# Patient Record
Sex: Female | Born: 1975 | Race: White | Hispanic: No | Marital: Married | Smoking: Never smoker
Health system: Southern US, Community
[De-identification: ages and names within clinical notes are randomized; demographics above are authoritative.]

## PROBLEM LIST (undated history)

## (undated) DIAGNOSIS — R519 Headache, unspecified: Secondary | ICD-10-CM

## (undated) DIAGNOSIS — J45909 Unspecified asthma, uncomplicated: Secondary | ICD-10-CM

## (undated) DIAGNOSIS — B019 Varicella without complication: Secondary | ICD-10-CM

## (undated) DIAGNOSIS — M1990 Unspecified osteoarthritis, unspecified site: Secondary | ICD-10-CM

## (undated) DIAGNOSIS — I4891 Unspecified atrial fibrillation: Secondary | ICD-10-CM

## (undated) DIAGNOSIS — G43909 Migraine, unspecified, not intractable, without status migrainosus: Secondary | ICD-10-CM

## (undated) DIAGNOSIS — I471 Supraventricular tachycardia, unspecified: Secondary | ICD-10-CM

## (undated) DIAGNOSIS — F32A Depression, unspecified: Secondary | ICD-10-CM

## (undated) HISTORY — DX: Depression, unspecified: F32.A

## (undated) HISTORY — DX: Supraventricular tachycardia, unspecified: I47.10

## (undated) HISTORY — PX: KNEE SURGERY: SHX244

## (undated) HISTORY — PX: GALLBLADDER SURGERY: SHX652

## (undated) HISTORY — DX: Headache, unspecified: R51.9

## (undated) HISTORY — PX: BREAST BIOPSY: SHX20

## (undated) HISTORY — DX: Unspecified osteoarthritis, unspecified site: M19.90

## (undated) HISTORY — PX: SHOULDER ARTHROSCOPY: SHX128

## (undated) HISTORY — DX: Unspecified asthma, uncomplicated: J45.909

## (undated) HISTORY — DX: Varicella without complication: B01.9

## (undated) HISTORY — DX: Migraine, unspecified, not intractable, without status migrainosus: G43.909

## (undated) HISTORY — DX: Unspecified atrial fibrillation: I48.91

## (undated) HISTORY — DX: Supraventricular tachycardia: I47.1

---
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
## 2010-01-15 ENCOUNTER — Encounter: Admission: RE | Admit: 2010-01-15 | Discharge: 2010-01-15 | Payer: Self-pay | Admitting: Family Medicine

## 2010-01-16 ENCOUNTER — Encounter: Payer: Self-pay | Admitting: Unknown Physician Specialty

## 2011-06-16 HISTORY — PX: CHOLECYSTECTOMY: SHX55

## 2011-06-16 HISTORY — PX: ENDOMETRIAL ABLATION: SHX621

## 2013-03-03 DIAGNOSIS — Z832 Family history of diseases of the blood and blood-forming organs and certain disorders involving the immune mechanism: Secondary | ICD-10-CM | POA: Insufficient documentation

## 2015-11-28 ENCOUNTER — Ambulatory Visit (INDEPENDENT_AMBULATORY_CARE_PROVIDER_SITE_OTHER): Payer: 59

## 2015-11-28 ENCOUNTER — Other Ambulatory Visit: Payer: Self-pay | Admitting: Unknown Physician Specialty

## 2015-11-28 DIAGNOSIS — R1031 Right lower quadrant pain: Secondary | ICD-10-CM

## 2015-11-28 DIAGNOSIS — R1032 Left lower quadrant pain: Secondary | ICD-10-CM | POA: Diagnosis not present

## 2016-06-12 ENCOUNTER — Ambulatory Visit (INDEPENDENT_AMBULATORY_CARE_PROVIDER_SITE_OTHER): Payer: 59

## 2016-06-12 ENCOUNTER — Other Ambulatory Visit: Payer: Self-pay | Admitting: Unknown Physician Specialty

## 2016-06-12 DIAGNOSIS — R0602 Shortness of breath: Secondary | ICD-10-CM

## 2016-06-12 DIAGNOSIS — R079 Chest pain, unspecified: Secondary | ICD-10-CM

## 2016-06-15 HISTORY — PX: SHOULDER ARTHROSCOPY: SHX128

## 2016-06-15 HISTORY — PX: KNEE ARTHROSCOPY: SUR90

## 2018-04-14 IMAGING — DX DG CHEST 2V
2 series · 2 of 2 positions shown · non-contrast
Comparison: None.

CLINICAL DATA: Central chest pain, shortness of Breath

EXAM:
CHEST  2 VIEW

[chest pa]
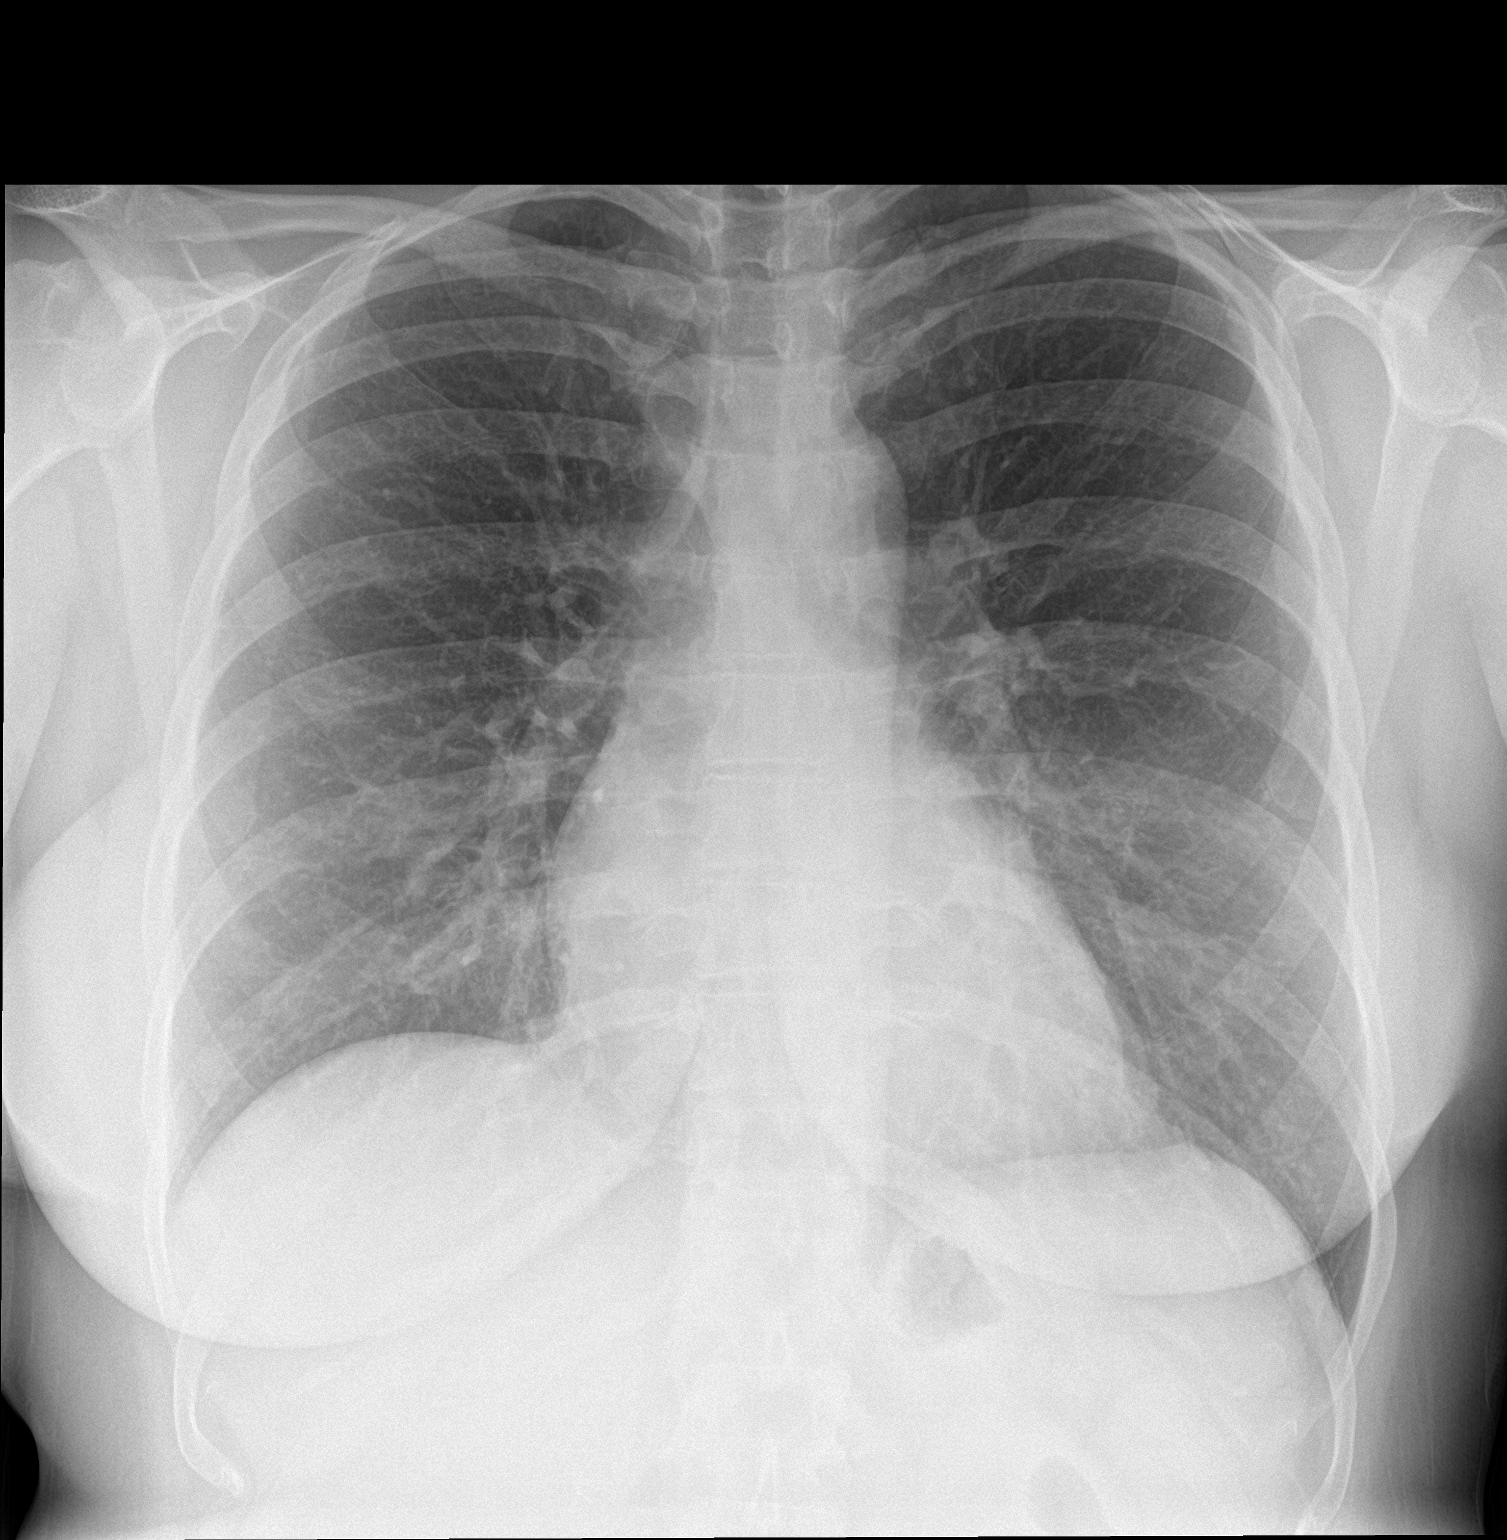

[chest lat]
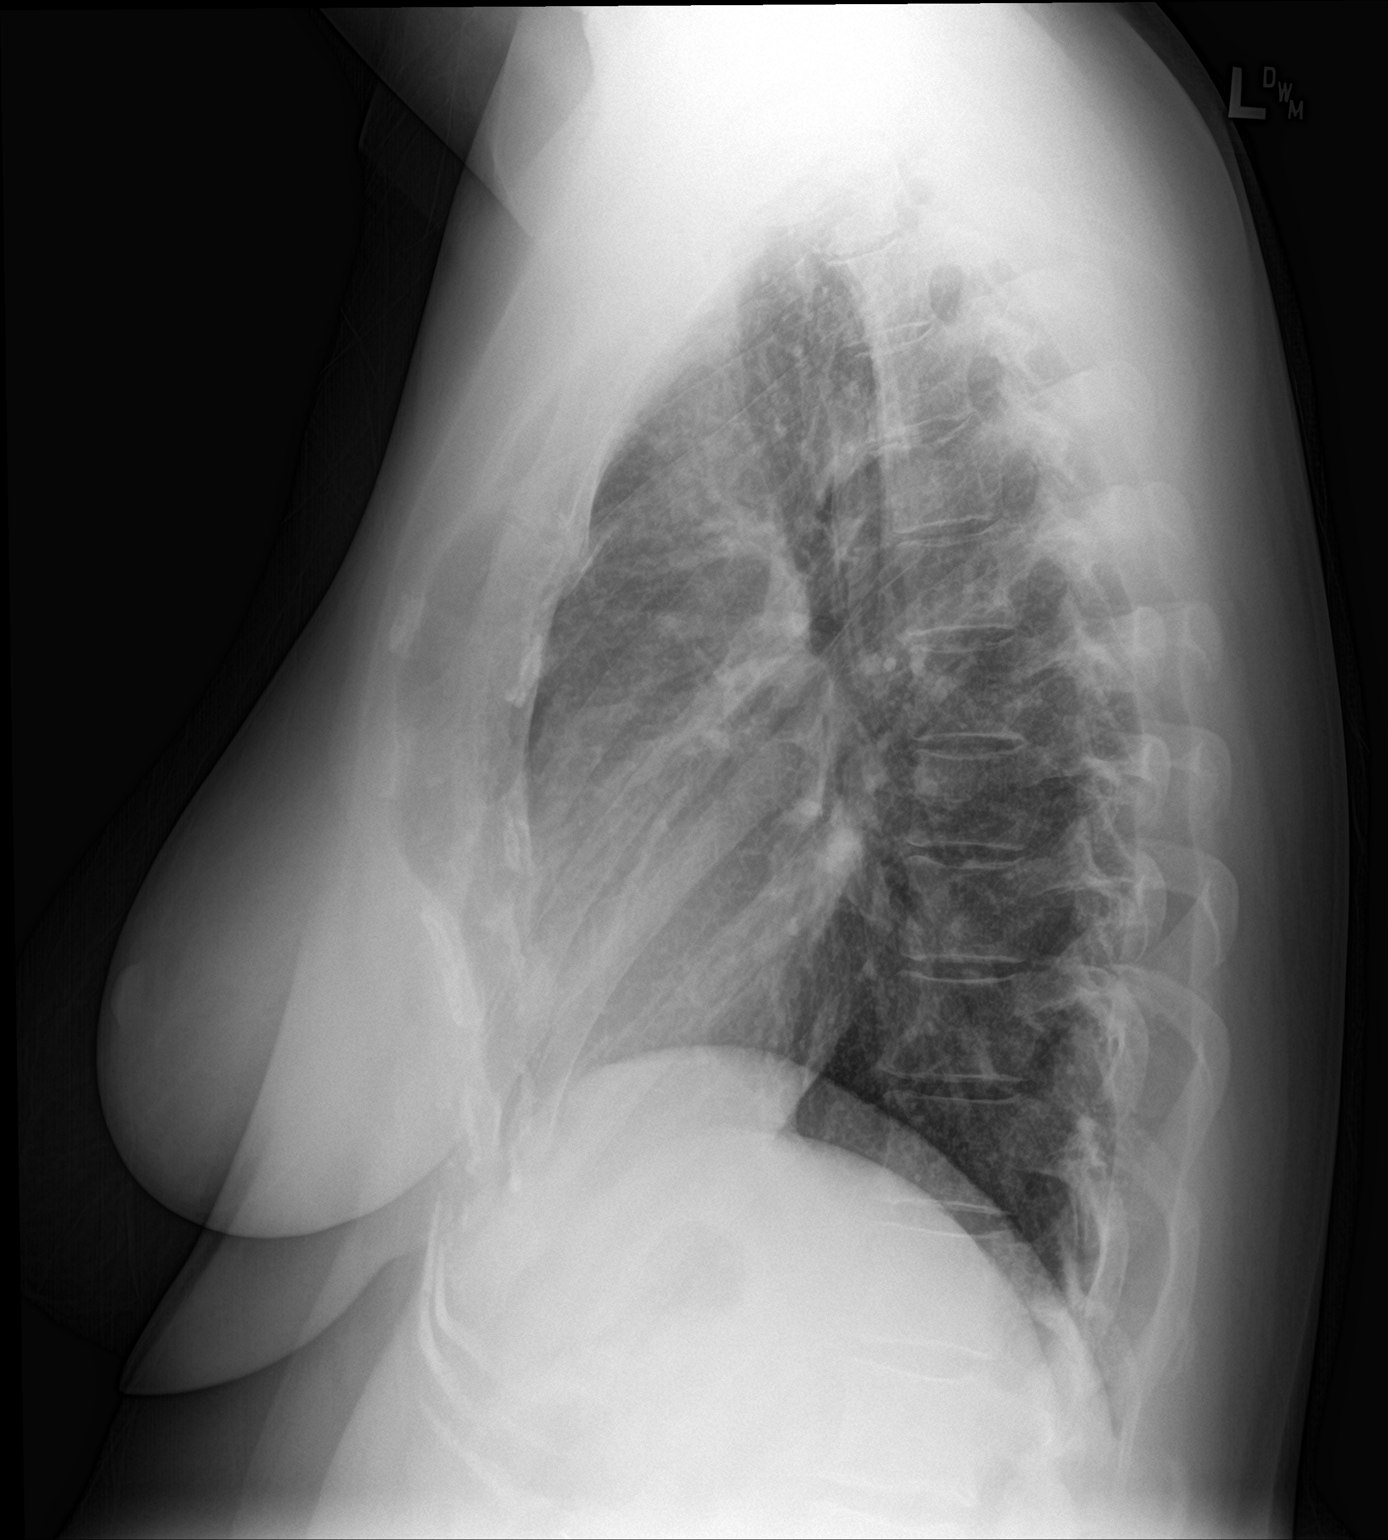

[2 of 2 positions shown; findings below may reference images not displayed]

FINDINGS: The heart size and mediastinal contours are within normal limits.
Both lungs are clear. The visualized skeletal structures are
unremarkable.
IMPRESSION: No active cardiopulmonary disease.

## 2018-06-15 HISTORY — PX: BREAST BIOPSY: SHX20

## 2018-12-28 ENCOUNTER — Other Ambulatory Visit: Payer: Self-pay

## 2018-12-28 ENCOUNTER — Telehealth: Payer: Self-pay | Admitting: Allergy & Immunology

## 2018-12-28 DIAGNOSIS — Z20828 Contact with and (suspected) exposure to other viral communicable diseases: Secondary | ICD-10-CM

## 2018-12-28 DIAGNOSIS — Z20822 Contact with and (suspected) exposure to covid-19: Secondary | ICD-10-CM

## 2018-12-28 NOTE — Telephone Encounter (Signed)
Patient requested COVID19 testing as a requirement prior to a job. Order placed. Patient was instructed to go to the testing site for collection.  Mi Balla, MD [facility] 

## 2019-01-01 LAB — NOVEL CORONAVIRUS, NAA: SARS-CoV-2, NAA: NOT DETECTED

## 2019-10-02 ENCOUNTER — Ambulatory Visit (INDEPENDENT_AMBULATORY_CARE_PROVIDER_SITE_OTHER): Payer: 59

## 2019-10-02 ENCOUNTER — Other Ambulatory Visit: Payer: Self-pay

## 2019-10-02 ENCOUNTER — Other Ambulatory Visit: Payer: Self-pay | Admitting: Unknown Physician Specialty

## 2019-10-02 DIAGNOSIS — R0602 Shortness of breath: Secondary | ICD-10-CM | POA: Diagnosis not present

## 2019-11-25 ENCOUNTER — Ambulatory Visit: Payer: Self-pay

## 2019-11-25 ENCOUNTER — Emergency Department
Admission: EM | Admit: 2019-11-25 | Discharge: 2019-11-25 | Disposition: A | Discharge status: Home / Self Care | Payer: 59 | Source: Home / Self Care

## 2019-11-25 ENCOUNTER — Encounter: Payer: Self-pay | Admitting: Emergency Medicine

## 2019-11-25 ENCOUNTER — Other Ambulatory Visit: Payer: Self-pay

## 2019-11-25 DIAGNOSIS — J020 Streptococcal pharyngitis: Secondary | ICD-10-CM | POA: Diagnosis not present

## 2019-11-25 DIAGNOSIS — L550 Sunburn of first degree: Secondary | ICD-10-CM

## 2019-11-25 DIAGNOSIS — M7989 Other specified soft tissue disorders: Secondary | ICD-10-CM

## 2019-11-25 DIAGNOSIS — J029 Acute pharyngitis, unspecified: Secondary | ICD-10-CM | POA: Diagnosis not present

## 2019-11-25 LAB — POCT RAPID STREP A (OFFICE): Rapid Strep A Screen: NEGATIVE

## 2019-11-25 NOTE — ED Triage Notes (Signed)
Patient awoke this am to her right lower leg/foot swelling, no injury, tight feeling, some sun burn.

## 2019-11-25 NOTE — ED Provider Notes (Signed)
[facility]    CSN: [id number] Arrival date & time: 11/25/19  0849      History   Chief Complaint Chief Complaint  Patient presents with  . leg swelling    HPI Caroline Gill is a 44 y.o. female.   HPI Caroline Gill is a 44 y.o. female presenting to UC with c/o bilateral leg swelling and mild pain, worse in the Right leg compared to the Left. Pt states the swelling has improved but is still present.  She feels a tightness in her feet. She also reports mild pain where she got a sunburn on both her legs and feet this past week while she was at [employer]. She road back in a car, a 3-4 hour car ride, the other day.  She has applied body lotion to the sunburn without relief. No other treatments tried. Denies calf pain. No hx of blood clots. No recent surgery. No chest pain or SOB.  Pt also c/o a mild sore throat that started 2 days ago. Denies cough, congestion, fever, chills, n/v/d. No sick contacts.     History reviewed. No pertinent past medical history.  There are no problems to display for this patient.   History reviewed. No pertinent surgical history.  OB History   No obstetric history on file.      Home Medications    Prior to Admission medications   Medication Sig Start Date End Date Taking? Authorizing Provider  Levocetirizine Dihydrochloride (XYZAL PO) Take by mouth.   Yes [provider]  Montelukast Sodium (SINGULAIR PO) Take by mouth.   Yes [provider]    Family History No family history on file.  Social History Social History   Tobacco Use  . Smoking status: Never Smoker  . Smokeless tobacco: Never Used  Substance Use Topics  . Alcohol use: Not on file  . Drug use: Not on file     Allergies   Patient has no known allergies.   Review of Systems Review of Systems  Constitutional: Negative for chills and fever.  HENT: Positive for congestion (minimal), postnasal drip and sore throat. Negative for ear  pain and trouble swallowing.   Respiratory: Negative for cough.   Musculoskeletal: Positive for arthralgias, joint swelling and myalgias. Negative for gait problem.  Skin: Positive for color change. Negative for wound.  Neurological: Negative for dizziness and headaches.     Physical Exam Triage Vital Signs ED Triage Vitals [11/25/19 0917]  Enc Vitals Group     BP 120/80     Pulse Rate (!) 101     Resp      Temp      Temp src      SpO2 97 %     Weight      Height      Head Circumference      Peak Flow      Pain Score 0     Pain Loc      Pain Edu?      Excl. in [place]?    No data found.  Updated Vital Signs BP 120/80 (BP Location: Right Arm)   Pulse (!) 101   Temp 99.3 F (37.4 C) (Oral)   SpO2 97%   Visual Acuity Right Eye Distance:   Left Eye Distance:   Bilateral Distance:    Right Eye Near:   Left Eye Near:    Bilateral Near:     Physical Exam Vitals and nursing note reviewed.  Constitutional:  General: She is not in acute distress.    Appearance: Normal appearance. She is well-developed. She is not ill-appearing, toxic-appearing or diaphoretic.  HENT:     Head: Normocephalic and atraumatic.     Right Ear: Tympanic membrane and ear canal normal.     Left Ear: Tympanic membrane and ear canal normal.     Nose: Nose normal.     Right Sinus: No maxillary sinus tenderness or frontal sinus tenderness.     Left Sinus: No maxillary sinus tenderness or frontal sinus tenderness.     Mouth/Throat:     Lips: Pink.     Mouth: Mucous membranes are moist.     Pharynx: Oropharynx is clear. Uvula midline. Posterior oropharyngeal erythema (mild) present. No pharyngeal swelling, oropharyngeal exudate or uvula swelling.  Cardiovascular:     Rate and Rhythm: Normal rate and regular rhythm.     Pulses:          Dorsalis pedis pulses are 2+ on the right side and 2+ on the left side.       Posterior tibial pulses are 2+ on the right side and 2+ on the left side.    Pulmonary:     Effort: Pulmonary effort is normal. No respiratory distress.     Breath sounds: Normal breath sounds. No stridor. No wheezing, rhonchi or rales.  Musculoskeletal:        General: Swelling and tenderness present. Normal range of motion.     Cervical back: Normal range of motion.     Comments: Bilateral lower legs: mild edema bilaterally into feet. Calves are soft, non-tender. Muscle compartments are soft. No masses palpated.  Mild tenderness to anterior aspects of lower legs (superficial sunburn also noted on anterior aspect of lower legs and feet)  Full ROM knees, ankles and toes.   Skin:    General: Skin is warm and dry.     Capillary Refill: Capillary refill takes less than 2 seconds.  Neurological:     Mental Status: She is alert and oriented to person, place, and time.  Psychiatric:        Behavior: Behavior normal.      UC Treatments / Results  Labs (all labs ordered are listed, but only abnormal results are displayed) Labs Reviewed  STREP A DNA PROBE  POCT RAPID STREP A (OFFICE)    EKG   Radiology No results found.  Procedures Procedures (including critical care time)  Medications Ordered in UC Medications - No data to display  Initial Impression / Assessment and Plan / UC Course  I have reviewed the triage vital signs and the nursing notes.  Pertinent labs & imaging results that were available during my care of the patient were reviewed by me and considered in my medical decision making (see chart for details).     Rapid strep: NEGATIVE Bilateral leg swelling likely reaction to superficial sunburns and mild dehydration Low concern for DVT, however, due to reports of Right leg being more swollen than the Left this morning as well as recent 3-4 hour car ride, venous ultrasound ordered for bilateral lower extremities to r/o DVT. [place] not available at [facility] today, order placed for [facility]. AVS provided  Final Clinical Impressions(s) / UC Diagnoses    Final diagnoses:  Sore throat  Leg swelling  Sunburn of first degree     Discharge Instructions      You may take 500mg  acetaminophen every 4-6 hours or in combination with ibuprofen 400-600mg  every 6-8 hours as  needed for pain, inflammation, and fever.  Be sure to well hydrated with clear liquids and get at least 8 hours of sleep at night, preferably more while sick.   Please follow up with family medicine in 1 week if needed.  You have an ultrasound order for you at [facility].  This would be done on your legs to rule out a blood clot. If you decide not to go now, it is recommended you go if symptoms worsen- worsening swelling, pain, especially if one side is worse. A blood clot can be very dangerous as there is risk of it breaking off and going to the heart, lungs or brain- which can lead to heart attack, stroke, and even death.  Our office is open unitil 4PM this evening and 8a-4p tomorrow if you had additional questions or concerns.     ED Prescriptions    None     PDMP not reviewed this encounter.   Lurene Shadow, [place] 11/26/19 [phone]

## 2019-11-25 NOTE — Discharge Instructions (Signed)
  You may take 500mg  acetaminophen every 4-6 hours or in combination with ibuprofen 400-600mg  every 6-8 hours as needed for pain, inflammation, and fever.  Be sure to well hydrated with clear liquids and get at least 8 hours of sleep at night, preferably more while sick.   Please follow up with family medicine in 1 week if needed.  You have an ultrasound order for you at [facility].  This would be done on your legs to rule out a blood clot. If you decide not to go now, it is recommended you go if symptoms worsen- worsening swelling, pain, especially if one side is worse. A blood clot can be very dangerous as there is risk of it breaking off and going to the heart, lungs or brain- which can lead to heart attack, stroke, and even death.  Our office is open unitil 4PM this evening and 8a-4p tomorrow if you had additional questions or concerns.

## 2019-11-25 NOTE — ED Triage Notes (Signed)
Patient also c/o throat pain/swelling since Thursday.

## 2019-11-26 LAB — STREP A DNA PROBE: Group A Strep Probe: NOT DETECTED

## 2019-11-27 ENCOUNTER — Telehealth: Payer: Self-pay | Admitting: Emergency Medicine

## 2019-11-27 NOTE — Telephone Encounter (Signed)
Pt states leg pain and swelling has improved. She does not plan to get the venous ultrasound at this time.

## 2020-02-27 ENCOUNTER — Telehealth: Payer: Self-pay | Admitting: Allergy & Immunology

## 2020-02-27 MED ORDER — PREDNISONE 10 MG PO TABS: ORAL_TABLET | ORAL | 0 refills | Status: DC

## 2020-02-27 NOTE — Telephone Encounter (Signed)
Since him prednisone to help her get off her antihistamines in anticipation for the visit next week.   Malachi Bonds, MD [facility]

## 2020-03-04 ENCOUNTER — Other Ambulatory Visit: Payer: Self-pay

## 2020-03-04 ENCOUNTER — Encounter: Payer: Self-pay | Admitting: Allergy & Immunology

## 2020-03-04 ENCOUNTER — Ambulatory Visit: Payer: 59 | Admitting: Allergy & Immunology

## 2020-03-04 VITALS — BP 110/70 | HR 85 | Temp 97.7°F | Resp 18 | Ht 70.0 in | Wt 256.0 lb

## 2020-03-04 DIAGNOSIS — L239 Allergic contact dermatitis, unspecified cause: Secondary | ICD-10-CM | POA: Diagnosis not present

## 2020-03-04 DIAGNOSIS — J302 Other seasonal allergic rhinitis: Secondary | ICD-10-CM

## 2020-03-04 DIAGNOSIS — J3089 Other allergic rhinitis: Secondary | ICD-10-CM | POA: Diagnosis not present

## 2020-03-04 DIAGNOSIS — T7840XA Allergy, unspecified, initial encounter: Secondary | ICD-10-CM | POA: Insufficient documentation

## 2020-03-04 DIAGNOSIS — J4520 Mild intermittent asthma, uncomplicated: Secondary | ICD-10-CM | POA: Diagnosis not present

## 2020-03-04 DIAGNOSIS — T7840XD Allergy, unspecified, subsequent encounter: Secondary | ICD-10-CM | POA: Diagnosis not present

## 2020-03-04 DIAGNOSIS — J45909 Unspecified asthma, uncomplicated: Secondary | ICD-10-CM | POA: Insufficient documentation

## 2020-03-04 MED ORDER — MONTELUKAST SODIUM 10 MG PO TABS: 10.0000 mg | ORAL_TABLET | Freq: Every day | ORAL | 1 refills | Status: DC

## 2020-03-04 MED ORDER — LEVOCETIRIZINE DIHYDROCHLORIDE 5 MG PO TABS: 5.0000 mg | ORAL_TABLET | Freq: Every day | ORAL | 1 refills | Status: DC

## 2020-03-04 MED ORDER — EPINEPHRINE 0.3 MG/0.3ML IJ SOAJ
0.3000 mg | INTRAMUSCULAR | 1 refills | Status: DC | PRN
Start: 2020-03-04 — End: 2021-07-29

## 2020-03-04 NOTE — Patient Instructions (Addendum)
1. Allergic reaction - unknown trigger - All of the foods tested were negative today. - Copy of test results provided. - We are going to get some labs to rule out weird causes of allergic reactions, including a Brussels sprouts IgE - We will call you in 1-2 weeks with the results of the testing.  - Auvi[employer] refilled today. - They should call you to confirm your shipping address.   2. Seasonal and perennial allergic rhinitis - Testing today showed: grasses, ragweed, weeds, trees and dog - Copy of test results provided.  - Avoidance measures provided. - Continue with: Xyzal (levocetirizine) 5mg  tablet 1-2 times daily and Singulair (montelukast) 10mg  daily - You can use an extra dose of the antihistamine, if needed, for breakthrough symptoms.  - Consider nasal saline rinses 1-2 times daily to remove allergens from the nasal cavities as well as help with mucous clearance (this is especially helpful to do before the nasal sprays are given) - We could consider allergy shots in the future, although with your history of large local reactions, we would have to go slow and might combine it with epinephrine rinses to try to manage these.   3. Possible contact dermatitis - Consider patch testing in the future. - You can even bring in your own cosmetics to have these tested. - These are placed on a Monday and then read on a Wednesday and Friday.   4. Return in about 3 weeks (around 03/25/2020) for [facility] TESTING (can be placed here in [facility] on Monday October 11th).    Please inform Saturday of any Emergency Department visits, hospitalizations, or changes in symptoms. Call October 13 before going to the ED for breathing or allergy symptoms since we might be able to fit you in for a sick visit. Feel free to contact us anytime with any questions, problems, or concerns.  It was a pleasure to meet you today!  Websites that have reliable patient information: 1. American Academy of Asthma, Allergy, and Immunology:  www.aaaai.org 2. Food Allergy Research and Education (FARE): foodallergy.org 3. Mothers of Asthmatics: http://www.asthmacommunitynetwork.org 4. American College of Allergy, Asthma, and Immunology: www.acaai.org   COVID-19 Vaccine Information can be found at: [place] For questions related to vaccine distribution or appointments, please email [email or web address] or call [phone].     "Like" [email or web address] on Facebook and Instagram for our latest updates!        Make sure you are registered to vote! If you have moved or changed any of your contact information, you will need to get this updated before voting!  In some cases, you MAY be able to register to vote online: [phone]     True Test looks for the following sensitivities:      Reducing Pollen Exposure  The American Academy of Allergy, Asthma and Immunology suggests the following steps to reduce your exposure to pollen during allergy seasons.    1. Do not hang sheets or clothing out to dry; pollen may collect on these items. 2. Do not mow lawns or spend time around freshly cut grass; mowing stirs up pollen. 3. Keep windows closed at night.  Keep car windows closed while driving. 4. Minimize morning activities outdoors, a time when pollen counts are usually at their highest. 5. Stay indoors as much as possible when pollen counts or humidity is high and on windy days when pollen tends to remain in the air longer. 6. Use air conditioning when possible.  Many air conditioners have filters that trap the pollen spores.  7. Use a HEPA room air filter to remove pollen form the indoor air you breathe.  Control of Dog or Cat Allergen  Avoidance is the best way to manage a dog or cat allergy. If you have a dog or cat and are allergic to dog or cats, consider removing the dog or cat from the home. If you have a dog or cat but don't  want to find it a new home, or if your family wants a pet even though someone in the household is allergic, here are some strategies that may help keep symptoms at bay:  1. Keep the pet out of your bedroom and restrict it to only a few rooms. Be advised that keeping the dog or cat in only one room will not limit the allergens to that room. 2. Don't pet, hug or kiss the dog or cat; if you do, wash your hands with soap and water. 3. High-efficiency particulate air (HEPA) cleaners run continuously in a bedroom or living room can reduce allergen levels over time. 4. Regular use of a high-efficiency vacuum cleaner or a central vacuum can reduce allergen levels. 5. Giving your dog or cat a bath at least once a week can reduce airborne allergen.

## 2020-03-04 NOTE — Progress Notes (Signed)
NEW PATIENT  Date of Service/Encounter:  03/04/20  Referring provider: Lasandra Beech, MD   Assessment:   Allergic reaction - unclear trigger, but testing unrevealing with regards to food allergies  Seasonal and perennial allergic rhinitis (grasses, weeds, ragweed, trees, dog)  Possible contact dermatitis - getting True Test patching done in three weeks   It is really unclear what is causing Caroline Gill's reactions, but we are going to do an extensive workup to rule out serious causes of swelling and hives. We have a few things going on with her, it seems, including contact dermatitis. We are going to do patch testing to clarify any any allergic contact dermatitis triggers. We are also going to rule out causes of swelling.hives to see if this is contributing to her symptoms, including a tryptase to rule out mast cell disease. We are refilling her epinephrine on the off chance that she she needs it. Hopefully this lab work will point towards an etiology of her symptoms. We could also consider the addition of Xolair as an immunomodulatory agent to prevent these episodes, but we have some more steps to do before we get to that point.  Plan/Recommendations:   1. Allergic reaction - unknown trigger - All of the foods tested were negative today. - Copy of test results provided. - We are going to get some labs to rule out weird causes of allergic reactions, including a Brussels sprouts IgE - We will call you in 1-2 weeks with the results of the testing.  - AuviQ refilled today. - They should call you to confirm your shipping address.   2. Seasonal and perennial allergic rhinitis - Testing today showed: grasses, ragweed, weeds, trees and dog - Copy of test results provided.  - Avoidance measures provided. - Continue with: Xyzal (levocetirizine)  tablet 1-2 times daily and Singulair (montelukast)  daily - You can use an extra dose of the antihistamine, if needed, for breakthrough  symptoms.  - Consider nasal saline rinses 1-2 times daily to remove allergens from the nasal cavities as well as help with mucous clearance (this is especially helpful to do before the nasal sprays are given) - We could consider allergy shots in the future, although with your history of large local reactions, we would have to go slow and might combine it with epinephrine rinses to try to manage these.   3. Possible contact dermatitis - Consider patch testing in the future. - You can even bring in your own cosmetics to have these tested. - These are placed on a Monday and then read on a Wednesday and Friday.   4. Return in about 3 weeks (around 03/25/2020) for [facility] TESTING (can be placed here in [facility] on Monday October 11th).    Subjective:   Caroline Gill is a 44 y.o. female presenting today for evaluation of  Chief Complaint  Patient presents with  . Allergic Reaction    Caroline Gill has a history of the following: Patient Active Problem List   Diagnosis Date Noted  . Allergic reaction 03/04/2020  . Extrinsic asthma 03/04/2020  . Seasonal and perennial allergic rhinitis 03/04/2020  . Allergic contact dermatitis 03/04/2020    History obtained from: chart review and patient.  Caroline Gill was referred by Caroline Beech, MD.     Caroline Gill is a 44 y.o. female presenting for an evaluation of allergic reactions. Last Xyzal was Thursday night.   She has a history of reactions for years. She has had allergies for  15-20 years. It really got bad and she did shots for one year. She had allergic reactions to the shots themselves. She continued to have large local reactions that involved her entire upper arm. This was in 2019 or possibly before that.  She had a reaction to a charcoal mask with hives and itching over her face. This took several days to clear up and it finally cleared up with three Benadryl.   Then one week ago Friday, she was eating at a restaurant for lunch  (September 10th) and it had Brussels sprouts. She reports a history of numbness in the whole inside of her mouth. This has happened twice with the same result, so she did not go ahead with it. She had a meal that had Brussels sprouts in the mixed vegetable part. She started having itching of her mouth and then she was in the bathroom. She had diarrhea afterwards and went home. She slept on the cough and then it started going to the side of her face and cheeks. She took two Xyzal that evening. Her chest tightness worsened. She did not want to take Benadryl because it knocks her out completely. Her husband came home at 11pm and she took Benadryl. The hives were better by the next morning and she felt sick.   Then on Tuesday September 14th, she put on some lotion from [place] and [employer]. She started feeling phlegmy and it cleared up with Benadryl.   She has not noticed any other foods that bother her. She does not eat cabbage. She seems to be fine with most of the major food allergens. She does have some nausea with eggs. Milk does not seem to bother her. She eats macadamia milk. She does have an AuviQ that expired, otherwise she does not need it.    Asthma/Respiratory Symptom History: She has a history of wheezing with albuterol. She typically never refills her albuterol.   Allergic Rhinitis Symptom History: She is currently on Xyzal and Singulair. She was on a nose spray at one point but she developed nosebleeds. She has a history of chronic postnasal drip and congestion. She has a history of environmental allergies to dogs.   Eczema Symptom History: She has a history of dry skin and uses Cerve for this. She has had Eucrisa  prescribed for her lesions on her legs.   Otherwise, there is no history of other atopic diseases, including drug allergies, stinging insect allergies, eczema, urticaria or contact dermatitis. There is no significant infectious history. Vaccinations are up to date.    Past Medical  History: Patient Active Problem List   Diagnosis Date Noted  . Allergic reaction 03/04/2020  . Extrinsic asthma 03/04/2020  . Seasonal and perennial allergic rhinitis 03/04/2020  . Allergic contact dermatitis 03/04/2020    Medication List:  Allergies as of 03/04/2020      Reactions   Oxycodone Other (See Comments)   Severe headache      Medication List       Accurate as of March 04, 2020  1:11 PM. If you have any questions, ask your nurse or doctor.        STOP taking these medications   predniSONE 10 MG tablet Commonly known as: DELTASONE Stopped by: Alfonse Spruce, MD     TAKE these medications   albuterol 108 (90 Base) MCG/ACT inhaler Commonly known as: VENTOLIN HFA Inhale 2 puffs into the lungs every 6 (six) hours as needed for wheezing or shortness of breath.  EPINEPHrine 0.3 mg/0.3 mL Soaj injection Commonly known as: Auvi-Q Inject 0.3 mg into the muscle as needed for anaphylaxis. Started by: Alfonse Spruce, MD   levocetirizine 5 MG tablet Commonly known as: XYZAL Take 1 tablet (5 mg total) by mouth daily. What changed:   medication strength  how much to take  when to take this Changed by: Alfonse Spruce, MD   montelukast 10 MG tablet Commonly known as: SINGULAIR Take 1 tablet (10 mg total) by mouth at bedtime. What changed:   medication strength  how much to take  when to take this Changed by: Alfonse Spruce, MD       Birth History: non-contributory  Developmental History: non-contributory  Past Surgical History: Past Surgical History:  Procedure Laterality Date  . GALLBLADDER SURGERY    . KNEE SURGERY Bilateral   . SHOULDER ARTHROSCOPY       Family History: Family History  Problem Relation Age of Onset  . Asthma Brother   . Angioedema Neg Hx   . Allergic rhinitis Neg Hx   . Immunodeficiency Neg Hx   . Urticaria Neg Hx   . Eczema Neg Hx      Social History: Hannie lives at home with her  husband and one of her children.  She has 2 children that are out of the house.  They live in a house that is 46 years old.  There is virtually vinyl plank in the main living areas and carpeting in the bedroom.  They have gas heating and central cooling.  There is a dog inside of the home.  There are no dust mite covers on the bedding.  There is no tobacco exposure.  Review of Systems  Constitutional: Negative.  Negative for chills, fever, malaise/fatigue and weight loss.  HENT: Positive for congestion and sinus pain. Negative for ear discharge and ear pain.   Eyes: Negative for pain, discharge and redness.  Respiratory: Negative for cough, sputum production, shortness of breath and wheezing.   Cardiovascular: Negative.  Negative for chest pain and palpitations.  Gastrointestinal: Negative for abdominal pain, constipation, diarrhea, heartburn, nausea and vomiting.  Skin: Positive for itching and rash.  Neurological: Negative for dizziness and headaches.  Endo/Heme/Allergies: Positive for environmental allergies. Does not bruise/bleed easily.       Objective:   Blood pressure 110/70, pulse 85, temperature 97.7 F (36.5 C), temperature source Temporal, resp. rate 18, height 5\' 10"  (1.778 m), weight 256 lb (116.1 kg), SpO2 95 %. Body mass index is 36.73 kg/m.   Physical Exam:   Physical Exam Constitutional:      Appearance: She is well-developed.     Comments: Very pleasant female.  Talkative.  HENT:     Head: Normocephalic and atraumatic.     Right Ear: Tympanic membrane, ear canal and external ear normal. No drainage, swelling or tenderness. Tympanic membrane is not injected, scarred, erythematous, retracted or bulging.     Left Ear: Tympanic membrane, ear canal and external ear normal. No drainage, swelling or tenderness. Tympanic membrane is not injected, scarred, erythematous, retracted or bulging.     Nose: No nasal deformity, septal deviation, mucosal edema or rhinorrhea.      Right Turbinates: Enlarged and swollen.     Left Turbinates: Enlarged and swollen.     Right Sinus: No maxillary sinus tenderness or frontal sinus tenderness.     Left Sinus: No maxillary sinus tenderness or frontal sinus tenderness.     Comments: There is some cobblestoning  in the posterior oropharynx.    Mouth/Throat:     Mouth: Mucous membranes are not pale and not dry.     Pharynx: Uvula midline.  Eyes:     General:        Right eye: No discharge.        Left eye: No discharge.     Conjunctiva/sclera: Conjunctivae normal.     Right eye: Right conjunctiva is not injected. No chemosis.    Left eye: Left conjunctiva is not injected. No chemosis.    Pupils: Pupils are equal, round, and reactive to light.  Cardiovascular:     Rate and Rhythm: Normal rate and regular rhythm.     Heart sounds: Normal heart sounds.  Pulmonary:     Effort: Pulmonary effort is normal. No tachypnea, accessory muscle usage or respiratory distress.     Breath sounds: Normal breath sounds. No wheezing, rhonchi or rales.     Comments: Moving air well in all lung fields.  No increased work of breathing. Chest:     Chest wall: No tenderness.  Abdominal:     Tenderness: There is no abdominal tenderness. There is no guarding or rebound.  Lymphadenopathy:     Head:     Right side of head: No submandibular, tonsillar or occipital adenopathy.     Left side of head: No submandibular, tonsillar or occipital adenopathy.     Cervical: No cervical adenopathy.  Skin:    Coloration: Skin is not pale.     Findings: No abrasion, erythema, petechiae or rash. Rash is not papular, urticarial or vesicular.     Comments: No dermatographia present.  Neurological:     Mental Status: She is alert.      Diagnostic studies:   Allergy Studies:     Airborne Adult Perc - 03/04/20 0900    Time Antigen Placed 4403    Allergen Manufacturer Waynette Buttery    Location Back    Number of Test 59    Panel 1 Select    1. Control-Buffer 50%  Glycerol Negative    2. Control-Histamine 1 mg/ml 2+    3. Albumin saline Negative    4. Bahia 2+    5. [place] 3+    6. Johnson 4+    7. Kentucky Blue 3+    8. Meadow Fescue 3+    9. Perennial Rye 3+    10. Sweet Vernal 3+    11. Timothy 2+    12. Cocklebur Negative    13. Burweed Marshelder Negative    14. Ragweed, short Negative    15. Ragweed, Giant Negative    16. Plantain,  English Negative    17. Lamb's Quarters Negative    18. Sheep Sorrell Negative    19. Rough Pigweed Negative    20. Marsh Elder, Rough Negative    21. Mugwort, Common Negative    22. Ash mix Negative    23. Birch mix Negative    24. Gill American Negative    25. Box, Elder Negative    26. Cedar, red Negative    27. Cottonwood, Guinea-Bissau Negative    28. Elm mix Negative    29. Hickory --   +/-   30. Maple mix Negative    31. Oak, Guinea-Bissau mix Negative    32. Pecan Pollen Negative    33. Pine mix Negative    34. Sycamore Eastern Negative    35. Walnut, Black Pollen Negative    36. Alternaria alternata Negative  37. Cladosporium Herbarum Negative    38. Aspergillus mix Negative    39. Penicillium mix Negative    40. Bipolaris sorokiniana (Helminthosporium) Negative    41. Drechslera spicifera (Curvularia) Negative    42. Mucor plumbeus Negative    43. Fusarium moniliforme Negative    44. Aureobasidium pullulans (pullulara) Negative    45. Rhizopus oryzae Negative    46. Botrytis cinera Negative    47. Epicoccum nigrum Negative    48. Phoma betae Negative    49. Candida Albicans Negative    50. Trichophyton mentagrophytes Negative    51. Mite, D Farinae  5,000 AU/ml Negative    52. Mite, D Pteronyssinus  5,000 AU/ml Negative    53. Cat Hair 10,000 BAU/ml Negative    54.  Dog Epithelia Negative    55. Mixed Feathers Negative    56. Horse Epithelia Negative    57. Cockroach, German Negative    58. Mouse Negative    59. Tobacco Leaf Negative          Intradermal - 03/04/20 1050    Time  Antigen Placed 1030    Allergen Manufacturer Greer    Location Arm    Number of Test 12    Intradermal Select    Control Negative    Ragweed mix 2+    Weed mix 1+    Tree mix 2+    Mold 1 Negative    Mold 2 Negative    Mold 3 Negative    Mold 4 Negative    Cat Negative    Dog 2+    Cockroach Negative    Mite mix Negative          Food Adult Perc - 03/04/20 0900    Time Antigen Placed [id number]    Allergen Manufacturer Waynette ButteryGreer    Location Back    Number of allergen test 72    Panel 2 Select    Control-Histamine 1 mg/ml 2+    1. Peanut Negative    2. Soybean Negative    3. Wheat Negative    4. Sesame Negative    5. Milk, cow Negative    6. Egg White, Chicken Negative    7. Casein Negative    8. Shellfish Mix Negative    9. Fish Mix Negative    10. Cashew Negative    11. Pecan Food Negative    12. Walnut Food Negative    13. Almond Negative    14. Hazelnut Negative    15. EstoniaBrazil nut Negative    16. Coconut Negative    17. Pistachio Negative    18. Catfish Negative    19. Bass Negative    20. Trout Negative    21. Tuna Negative    22. Salmon Negative    23. Flounder Negative    24. Codfish Negative    25. Shrimp Negative    26. Crab Negative    27. Lobster Negative    28. Oyster Negative    29. Scallops Negative    30. Barley Negative    31. Oat  Negative    32. Rye  Negative    33. Hops Negative    34. Rice Negative    35. Cottonseed Negative    36. Saccharomyces Cerevisiae  Negative    37. Pork Negative    38. Malawiurkey Meat Negative    39. Chicken Meat Negative    40. Beef Negative    41. Lamb Negative  42. Tomato Negative    43. White Potato Negative    44. Sweet Potato Negative    45. Pea, Green/English Negative    46. Navy Bean Negative    47. Mushrooms Negative    48. Avocado Negative    49. Onion Negative    50. Cabbage Negative    51. Carrots Negative    52. Celery Negative    53. Corn Negative    54. Cucumber Negative    55. Grape (White  seedless) Negative    56. Orange  Negative    57. Banana Negative    58. Apple Negative    59. Peach Negative    60. Strawberry Negative    61. Cantaloupe Negative    62. Watermelon Negative    63. Pineapple Negative    64. Chocolate/Cacao bean Negative    65. Karaya Gum Negative    66. Acacia (Arabic Gum) Negative    67. Cinnamon Negative    68. Nutmeg Negative    69. Ginger Negative    70. Garlic Negative    71. Pepper, black Negative    72. Mustard Negative           Allergy testing results were read and interpreted by myself, documented by clinical staff.         Malachi Bonds, MD [facility]

## 2020-03-08 NOTE — Progress Notes (Signed)
Her last testing was done in February 2016.  It was positive to trees, grasses, weeds, and dog.  We received a lot of other notes from her previous allergist, but it was all fairly extraneous and I decided not to scan this into the system.  Malachi Bonds, MD [facility]

## 2020-03-17 LAB — ALLERGEN,BRUSSEL SPRTS,RF217: Allergen Brussel Sprouts IgE: <0.1 kU/L

## 2020-03-19 LAB — CBC WITH DIFFERENTIAL/PLATELET
Basophils Absolute: 0.1 10*3/uL (ref 0.0–0.2)
Basos: 1 %
EOS (ABSOLUTE): 0.3 10*3/uL (ref 0.0–0.4)
Eos: 3 %
Hematocrit: 35.4 % (ref 34.0–46.6)
Hemoglobin: 12.3 g/dL (ref 11.1–15.9)
Immature Grans (Abs): 0 10*3/uL (ref 0.0–0.1)
Immature Granulocytes: 0 %
Lymphocytes Absolute: 3 10*3/uL (ref 0.7–3.1)
Lymphs: 30 %
MCH: 29.1 pg (ref 26.6–33.0)
MCHC: 34.7 g/dL (ref 31.5–35.7)
MCV: 84 fL (ref 79–97)
Monocytes Absolute: 0.5 10*3/uL (ref 0.1–0.9)
Monocytes: 5 %
Neutrophils Absolute: 6 10*3/uL (ref 1.4–7.0)
Neutrophils: 61 %
Platelets: 246 10*3/uL (ref 150–450)
RBC: 4.23 x10E6/uL (ref 3.77–5.28)
RDW: 12.1 % (ref 11.7–15.4)
WBC: 9.9 10*3/uL (ref 3.4–10.8)

## 2020-03-19 LAB — CMP14+EGFR
ALT: 27 IU/L (ref 0–32)
AST: 25 IU/L (ref 0–40)
Albumin/Globulin Ratio: 1.6 (ref 1.2–2.2)
Albumin: 4.2 g/dL (ref 3.8–4.8)
Alkaline Phosphatase: 116 IU/L (ref 44–121)
BUN/Creatinine Ratio: 16 (ref 9–23)
BUN: 10 mg/dL (ref 6–24)
Bilirubin Total: <0.2 mg/dL (ref 0.0–1.2)
CO2: 23 mmol/L (ref 20–29)
Calcium: 8.8 mg/dL (ref 8.7–10.2)
Chloride: 103 mmol/L (ref 96–106)
Creatinine, Ser: 0.64 mg/dL (ref 0.57–1.00)
GFR calc Af Amer: 124 mL/min/{1.73_m2} (ref >59)
GFR calc non Af Amer: 107 mL/min/{1.73_m2} (ref >59)
Globulin, Total: 2.6 g/dL (ref 1.5–4.5)
Glucose: 104 mg/dL — ABNORMAL HIGH (ref 65–99)
Potassium: 3.9 mmol/L (ref 3.5–5.2)
Sodium: 140 mmol/L (ref 134–144)
Total Protein: 6.8 g/dL (ref 6.0–8.5)

## 2020-03-19 LAB — ALPHA-GAL PANEL
Alpha Gal IgE*: <0.1 kU/L (ref <0.10)
Beef (Bos spp) IgE: 0.1 kU/L (ref <0.35)
Class Interpretation: 0
Lamb/Mutton (Ovis spp) IgE: 0.12 kU/L (ref <0.35)
Pork (Sus spp) IgE: <0.1 kU/L (ref <0.35)

## 2020-03-19 LAB — ANA W/REFLEX IF POSITIVE: Anti Nuclear Antibody (ANA): NEGATIVE

## 2020-03-19 LAB — CHRONIC URTICARIA: cu index: 2.3 (ref <10)

## 2020-03-19 LAB — TRYPTASE: Tryptase: 6.9 ug/L (ref 2.2–13.2)

## 2020-03-19 LAB — SEDIMENTATION RATE: Sed Rate: 20 mm/hr (ref 0–32)

## 2020-03-19 LAB — C-REACTIVE PROTEIN: CRP: 18 mg/L — ABNORMAL HIGH (ref 0–10)

## 2020-03-20 ENCOUNTER — Encounter: Payer: Self-pay | Admitting: Allergy & Immunology

## 2020-03-25 ENCOUNTER — Ambulatory Visit (INDEPENDENT_AMBULATORY_CARE_PROVIDER_SITE_OTHER): Payer: 59 | Admitting: Allergy & Immunology

## 2020-03-25 ENCOUNTER — Encounter: Payer: Self-pay | Admitting: Allergy & Immunology

## 2020-03-25 ENCOUNTER — Other Ambulatory Visit: Payer: Self-pay

## 2020-03-25 ENCOUNTER — Ambulatory Visit: Payer: 59

## 2020-03-25 ENCOUNTER — Ambulatory Visit: Payer: 59 | Admitting: Allergy & Immunology

## 2020-03-25 VITALS — BP 118/68 | HR 80 | Resp 18

## 2020-03-25 DIAGNOSIS — J3089 Other allergic rhinitis: Secondary | ICD-10-CM

## 2020-03-25 DIAGNOSIS — L239 Allergic contact dermatitis, unspecified cause: Secondary | ICD-10-CM | POA: Diagnosis not present

## 2020-03-25 DIAGNOSIS — J302 Other seasonal allergic rhinitis: Secondary | ICD-10-CM

## 2020-03-25 DIAGNOSIS — R899 Unspecified abnormal finding in specimens from other organs, systems and tissues: Secondary | ICD-10-CM

## 2020-03-25 DIAGNOSIS — T7840XD Allergy, unspecified, subsequent encounter: Secondary | ICD-10-CM

## 2020-03-25 DIAGNOSIS — J4520 Mild intermittent asthma, uncomplicated: Secondary | ICD-10-CM

## 2020-03-25 MED ORDER — TRIAMCINOLONE ACETONIDE 0.1 % EX OINT: 1.0000 "application " | TOPICAL_OINTMENT | Freq: Two times a day (BID) | CUTANEOUS | 2 refills | Status: DC | PRN

## 2020-03-25 NOTE — Progress Notes (Addendum)
error 

## 2020-03-25 NOTE — Progress Notes (Signed)
FOLLOW UP  Date of Service/Encounter:  03/25/20   Assessment:   Allergic reaction - unclear trigger, but testing unrevealing with regards to food allergies  Seasonal and perennial allergic rhinitis (grasses, weeds, ragweed, trees, dog)  Possible contact dermatitis - True Test patches placed  Elevated CRP - repeating pending in three weeks' time  Plan/Recommendations:   1. Possible contact dermatitis - Patches placed today. - No showering or getting them wet for the next 48 hours. - Come back in 48 hours for a reading (in [place]).  2. Abnormal labs - We are going to re-do the CRP (get this in three weeks) to see a trend. - We are also getting a hemoglobin A1c to see what your average glucose level has been over the last three months. - But I agree with your decision to work on weight loss.   3. Return in about 2 days (around 03/27/2020).   Subjective:   Evaleigh Mccamy is a 44 y.o. female presenting today for follow up of  Chief Complaint  Patient presents with  . Patch Testing    Kesa Birky has a history of the following: Patient Active Problem List   Diagnosis Date Noted  . Allergic reaction 03/04/2020  . Extrinsic asthma 03/04/2020  . Seasonal and perennial allergic rhinitis 03/04/2020  . Allergic contact dermatitis 03/04/2020    History obtained from: chart review and patient.  Eulalah is a 44 y.o. female presenting for a follow up visit. She was last seen around three weeks ago for evaluation of her allergic reactions. Unfortunately, testing was unrevealing at the visit and subsequent lab workup was mostly normal aside from an elevated CRP. We did do environmental testing and she was positive to grasses, ragweed, weeds, trees, and dog. We continued with Xyzal 1-2 times daily and Singulair 10mg  daily. We were concerned with possible contact dermatitis, therefore we recommended patch testing. We also provided her with a script for AuviQ and gave her  instructions on how and when to use it.   Since the last visit, she has done fairly well. She has not had any subsequent reactions in the interim. She has not had any reactions at all. Her reactions are fairly few and far between, but her most recent reactions occurred so close to each other that she became very worried. This is what brought her in at the last visit.   She does have questions about her elevated inflammatory markers. She was also concerned about her elevated glucose. This has prompted her to make some lifestyle changes and she is now watching her diet and exercising.   Otherwise, there have been no changes to her past medical history, surgical history, family history, or social history.    Review of Systems  Constitutional: Negative.  Negative for chills, fever, malaise/fatigue and weight loss.  HENT: Negative for congestion, ear discharge, ear pain and sinus pain.   Eyes: Negative for pain, discharge and redness.  Respiratory: Negative for cough, sputum production, shortness of breath and wheezing.   Cardiovascular: Negative.  Negative for chest pain and palpitations.  Gastrointestinal: Negative for abdominal pain, constipation, diarrhea, heartburn, nausea and vomiting.  Skin: Negative.  Negative for itching and rash.  Neurological: Negative for dizziness and headaches.  Endo/Heme/Allergies: Negative for environmental allergies. Does not bruise/bleed easily.       Objective:   Blood pressure 118/68, pulse 80, resp. rate 18, SpO2 96 %. There is no height or weight on file to calculate BMI.   Physical  Exam:  Physical Exam Constitutional:      Appearance: She is well-developed.     Comments: Very pleasant female.  HENT:     Head: Normocephalic and atraumatic. No laceration.     Right Ear: Tympanic membrane, ear canal and external ear normal.     Left Ear: Tympanic membrane, ear canal and external ear normal.     Nose: No nasal deformity, septal deviation, mucosal  edema or rhinorrhea.     Right Turbinates: Enlarged and swollen.     Left Turbinates: Enlarged and swollen.     Right Sinus: No maxillary sinus tenderness or frontal sinus tenderness.     Left Sinus: No maxillary sinus tenderness or frontal sinus tenderness.     Mouth/Throat:     Mouth: Mucous membranes are not pale and not dry.     Pharynx: Uvula midline.  Eyes:     General:        Right eye: No discharge.        Left eye: No discharge.     Conjunctiva/sclera: Conjunctivae normal.     Right eye: Right conjunctiva is not injected. No chemosis.    Left eye: Left conjunctiva is not injected. No chemosis.    Pupils: Pupils are equal, round, and reactive to light.  Cardiovascular:     Rate and Rhythm: Normal rate and regular rhythm.     Heart sounds: Normal heart sounds.  Pulmonary:     Effort: Pulmonary effort is normal. No tachypnea, accessory muscle usage or respiratory distress.     Breath sounds: Normal breath sounds. No wheezing, rhonchi or rales.  Chest:     Chest wall: No tenderness.  Lymphadenopathy:     Cervical: No cervical adenopathy.  Skin:    Coloration: Skin is not pale.     Findings: No abrasion, erythema, petechiae or rash. Rash is not papular, urticarial or vesicular.     Comments: No skin findings today.  She does point to some areas on her ankles and lower legs, but I do not appreciate much of anything.  She does have some scaling over a dime sized area on her upper left arm.  She tells me that this was from the intradermal testing to the last visit. It has been pruritic.  Neurological:     Mental Status: She is alert.      Diagnostic studies: True Test patch testing placed + two additional items that she brought with her (lotion and an essential oil that she uses topically).        Malachi Bonds, MD  [facility]

## 2020-03-25 NOTE — Patient Instructions (Addendum)
1. Possible contact dermatitis - Patches placed today. - No showering or getting them wet for the next 48 hours. - Come back in 48 hours for a reading (in [place]).  2. Abnormal labs - We are going to re-do the CRP (get this in three weeks) to see a trend. - We are also getting a hemoglobin A1c to see what your average glucose level has been over the last three months. - But I agree with your decision to work on weight loss.   3. Return in about 2 days (around 03/27/2020).    Please inform us of any Emergency Department visits, hospitalizations, or changes in symptoms. Call us before going to the ED for breathing or allergy symptoms since we might be able to fit you in for a sick visit. Feel free to contact us anytime with any questions, problems, or concerns.  It was a pleasure to see you again today!  Websites that have reliable patient information: 1. American Academy of Asthma, Allergy, and Immunology: www.aaaai.org 2. Food Allergy Research and Education (FARE): foodallergy.org 3. Mothers of Asthmatics: http://www.asthmacommunitynetwork.org 4. American College of Allergy, Asthma, and Immunology: www.acaai.org   COVID-19 Vaccine Information can be found at: PodExchange.nl For questions related to vaccine distribution or appointments, please email [email or web address] or call [phone].     "Like" [place] on Facebook and Instagram for our latest updates!     HAPPY FALL!     Make sure you are registered to vote! If you have moved or changed any of your contact information, you will need to get this updated before voting!  In some cases, you MAY be able to register to vote online: [email or web address]

## 2020-03-26 NOTE — Progress Notes (Signed)
Follow Up Note  RE: Caroline Gill MRN: [id number] DOB: Jul 06, 1975 Date of Office Visit: 03/27/2020  Referring provider: Lasandra Beech, MD Primary care provider: Lasandra Beech, MD  History of Present Illness: I had the pleasure of seeing Caroline Gill for a follow up visit at the [facility] on 03/27/2020. Caroline Gill is a 44 y.o. female, who is being followed for possible contact dermatitis. Today Caroline Gill is here for initial patch test interpretation, given suspected history of contact dermatitis.   Diagnostics:  TRUE TEST, oil and lotion 48 hour reading:  T.R.U.E. Test - 03/27/20 1000    Time Antigen Placed 3151    Manufacturer Other    Lot # [id number]    Location Back    Number of Test 38    Reading Interval Day 1    Panel Panel 1;Panel 3;Panel 2    1. Nickel Sulfate 0    2. Wool Alcohols 0    3. Neomycin Sulfate 0    4. Potassium Dichromate 0    5. Caine Mix 0    6. Fragrance Mix --   +/-   7. Colophony 0    8. Paraben Mix --   +/-   9. Negative Control 0    10. Balsam of [place] 0    11. Ethylenediamine Dihydrochloride 0    12. Cobalt Dichloride 0    13. p-tert Butylphenol Formaldehyde Resin 1    14. Epoxy Resin 0    15. Carba Mix 0    16.  Black Rubber Mix 0    17. Cl+ Me-Isothiazolinone 0    18. Quaternium-15 0    19. Methyldibromo Glutaronitrile 0    20. p-Phenylenediamine 0    21. Formaldehyde 0    22. Mercapto Mix 0    23. Thimerosal 0    24. Thiuram Mix 0    25. Diazolidinyl Urea 0    26. Quinoline Mix 0    27. Tixocortol-21-Pivalate 0    28. Gold Sodium Thiosulfate 1    29. Imidazolidinyl Urea 0    30. Budesonide 0    31. Hydrocortisone-17-Butyrate 0    32. Mercaptobenzothiazole 0    33. Bacitracin 0    34. Parthenolide 0    35. Disperse Blue 106 0    36. 2-Bromo-2-Nitropropane-1,3-diol 0          Metals Patch - 03/27/20 1000    Time Antigen Placed 0911    Manufacturer Other    Location Back    Number of Test 2    Reading  Interval Day 1    Select Select    Other 0    Other 2            Assessment and Plan: Caroline Gill is a 44 y.o. female with: Allergic contact dermatitis 48 hour reading positive to oil, p-tert butylphenol formaldehyde resin, gold. Borderline positive to fragrance mix and paraben mix.    The patient has been provided detailed information regarding the substances Caroline Gill is sensitive to, as well as products containing the substances.  Meticulous avoidance of these substances is recommended. If avoidance is not possible, the use of barrier creams or lotions is recommended.  Return in about 2 days (around 03/29/2020) for Patch reading.  It was my pleasure to see Caroline Gill today and participate in her care. Please feel free to contact me with any questions or concerns.  Sincerely,  Wyline Mood, DO Allergy & Immunology  [facility] office: [phone] [facility] office: [place] office: [phone]

## 2020-03-27 ENCOUNTER — Other Ambulatory Visit: Payer: Self-pay

## 2020-03-27 ENCOUNTER — Ambulatory Visit: Payer: 59 | Admitting: Allergy

## 2020-03-27 ENCOUNTER — Encounter: Payer: Self-pay | Admitting: Allergy

## 2020-03-27 DIAGNOSIS — L239 Allergic contact dermatitis, unspecified cause: Secondary | ICD-10-CM

## 2020-03-27 NOTE — Assessment & Plan Note (Addendum)
48 hour reading positive to oil, p-tert butylphenol formaldehyde resin, gold. Borderline positive to fragrance mix and paraben mix.

## 2020-03-29 ENCOUNTER — Encounter: Payer: Self-pay | Admitting: Family

## 2020-03-29 ENCOUNTER — Ambulatory Visit: Payer: 59 | Admitting: Family

## 2020-03-29 ENCOUNTER — Other Ambulatory Visit: Payer: Self-pay

## 2020-03-29 DIAGNOSIS — L239 Allergic contact dermatitis, unspecified cause: Secondary | ICD-10-CM

## 2020-03-29 NOTE — Progress Notes (Signed)
Luzmaria returns to the office today for the final patch test interpretation, given suspected history of contact dermatitis.    Diagnostics:   TRUE TEST 96-hour hour reading: negative reaction to #1 (Nickel Sulfate), negative reaction to #2 (Wool Alcohols), negative reaction to #3 (Neomycin sulfate), negative reaction to #4 (Potassium dichromate), negative reaction to #5 [employer] mix), negative reaction to  #6 (Fragrance mix), negative reaction to #7 (Colophony), negative reaction to #9 (Paraben mix), negative reaction to #10 (Balsam of [place]), negative reaction to #11 (Ethylenediamine dihydrochloride), negative reaction to #12 (Cobalt chloride), 1+ reaction to #13 (p-tert Butylphenol formaldehyde resin), negative reaction to #14 (Epoxy resin), negative reaction to #15 (Carba mix), negative reaction to #16 (Black rubber mix), negative reaction to #17 (Cl+Me-Isothiazolinone), negative reaction to #18 (Quaternium-15), negative reaction to #19 (Methyldibromo Glutaronitrile), negative reaction to #20 (p-Phenylene-diamine), negative reaction to #21 (Formaldehyde), negative reaction to #22 (Mercapto mix), negative reaction to #23 (Thiomersal), negative reaction to #24 (Thiuram mix), negative reaction to #25 (Diazolidinyl urea), negative reaction to #26 (Quinoline mix), negative reaction to #27 (Tixocortol-21-pivalate), +/- reaction to #28 (Gold sodium thiosulfate), negative reaction to #29 (Imidazolidinyl urea), negative reaction to #30 (Budesonide), negative reaction to #31 (Hydrocortisone-17-butyrate), negative reaction to #32 (Mercapto-benzothiazole), negative reaction to #33 (Bacitracin), negative reaction to #34 (Parthenolide), negative reaction to #35 (Disperse blue) and negative reaction to #36 (Bronopol) Also 2+ positive to oil  Plan:   Allergic contact dermatitis - The patient has been provided detailed information regarding the substances she is sensitive to, as well as products containing the  substances.   - Meticulous avoidance of these substances is recommended.  - If avoidance is not possible, the use of barrier creams or lotions is recommended. - If symptoms persist or progress despite meticulous avoidance of fragrance mix, paraben mix, p-tert Butylphenol Formaldehyde Resin,  Gold Sodium Thiosulfate, and oil a Dermatology Referral may be warranted.  Thank you for the opportunity to care for [facility]. Please do not hesitate to contact us with any problems.  Nehemiah Settle, FNP [facility]

## 2020-04-15 ENCOUNTER — Encounter: Payer: Self-pay | Admitting: Allergy & Immunology

## 2020-04-18 LAB — HEMOGLOBIN A1C
Est. average glucose Bld gHb Est-mCnc: 111 mg/dL
Hgb A1c MFr Bld: 5.5 % (ref 4.8–5.6)

## 2020-04-18 LAB — C-REACTIVE PROTEIN: CRP: 9 mg/L (ref 0–10)

## 2020-06-06 ENCOUNTER — Ambulatory Visit: Payer: 59 | Admitting: Allergy & Immunology

## 2020-06-06 ENCOUNTER — Other Ambulatory Visit: Payer: Self-pay

## 2020-06-06 ENCOUNTER — Encounter: Payer: Self-pay | Admitting: Allergy & Immunology

## 2020-06-06 VITALS — BP 132/88 | HR 71 | Temp 97.9°F | Resp 14 | Ht 70.5 in | Wt 256.8 lb

## 2020-06-06 DIAGNOSIS — L239 Allergic contact dermatitis, unspecified cause: Secondary | ICD-10-CM

## 2020-06-06 DIAGNOSIS — J4520 Mild intermittent asthma, uncomplicated: Secondary | ICD-10-CM

## 2020-06-06 DIAGNOSIS — J3089 Other allergic rhinitis: Secondary | ICD-10-CM | POA: Diagnosis not present

## 2020-06-06 DIAGNOSIS — T7840XD Allergy, unspecified, subsequent encounter: Secondary | ICD-10-CM | POA: Diagnosis not present

## 2020-06-06 DIAGNOSIS — J302 Other seasonal allergic rhinitis: Secondary | ICD-10-CM

## 2020-06-06 MED ORDER — ALBUTEROL SULFATE HFA 108 (90 BASE) MCG/ACT IN AERS: 2.0000 | INHALATION_SPRAY | Freq: Four times a day (QID) | RESPIRATORY_TRACT | 3 refills | Status: DC | PRN

## 2020-06-06 MED ORDER — IPRATROPIUM BROMIDE 0.06 % NA SOLN: 2.0000 | Freq: Four times a day (QID) | NASAL | 5 refills | Status: DC | PRN

## 2020-06-06 NOTE — Patient Instructions (Addendum)
1. Allergic reaction - unknown trigger - I have no idea what is going on with you.  Caroline Gill is up to date at least.  2. Seasonal and perennial allergic rhinitis (grasses, ragweed, weeds, trees and dog) - Continue with: Xyzal (levocetirizine) 5mg  tablet 1-2 times daily and Singulair (montelukast) 10mg  daily  - Add on: Atrovent nasal spray one spray per nostril up to four times daily as needed (can be overdrying). - You can use nasal saline gel to help keep the nasal passages moist.     3. Possible contact dermatitis (fragrance mix, paraben mix, gold, formaldehyde, Stress away essential oil) - Continue to avoid all of your triggers. - If you are tolerating a certain cosmetic without any problems, just keep it.    4. Return in about 6 months (around 12/05/2020).    Please inform of any Emergency Department visits, hospitalizations, or changes in symptoms. Call 12/07/2020 before going to the ED for breathing or allergy symptoms since we might be able to fit you in for a sick visit. Feel free to contact us anytime with any questions, problems, or concerns.  It was a pleasure to see you again today!  Websites that have reliable patient information: 1. American Academy of Asthma, Allergy, and Immunology: www.aaaai.org 2. Food Allergy Research and Education (FARE): foodallergy.org 3. Mothers of Asthmatics: http://www.asthmacommunitynetwork.org 4. American College of Allergy, Asthma, and Immunology: www.acaai.org   COVID-19 Vaccine Information can be found at: [place] For questions related to vaccine distribution or appointments, please email [email or web address] or call [phone].     Like [email or web address] on [phone] and Instagram for our latest updates!       Make sure you are registered to vote! If you have moved or changed any of your contact information, you will need to get this updated before voting!  In some cases, you  MAY be able to register to vote online: [place]

## 2020-06-06 NOTE — Progress Notes (Signed)
FOLLOW UP  Date of Service/Encounter:  06/06/20   Assessment:   Allergic reaction- unclear trigger, but testing unrevealing with regards to food allergies  Seasonal and perennial allergic rhinitis (grasses, weeds, ragweed, trees, dog) - was on allergen immunotherapy in the past with multiple large local reactions  Possible contact dermatitis (fragrance mix, paraben mix, gold, formaldehyde, Stress away essential oil)  Elevated CRP - normalized upon repeat   Overall, Dalanie seems to be doing fairly well.  She has not had one of her severe reactions since we started following her.  She does have an epinephrine autoinjector in place in case this does happen.  We are no closer to figuring out what is been going on, but we have done extensive work-up to rule out serious causes of allergic reactions.  Some of her reactions seem to be triggered by fragrances, which is more consistent with reactive airway dysfunction syndrome or vocal cord dysfunction.  She seems to be able to have a handle on this with avoidance measures.  Her skin is under much better control since she has been able to figure out what is triggering her contact dermatitis.  For instance, her rash on her lower extremity is completely resolved.  She has also gotten rid of a lot of jewelry which was triggering a lot of her rashes and itching.  She does continue to have a lot of postnasal drip and we will try to address this with Atrovent nasal spray.  She will give [place] an update we will see her again in 6 months.  Plan/Recommendations:   1. Allergic reaction - unknown trigger - I have no idea what is going on with you.  [name] is up to date at least.  2. Seasonal and perennial allergic rhinitis (grasses, ragweed, weeds, trees and dog) - Continue with: Xyzal (levocetirizine) 5mg  tablet 1-2 times daily and Singulair (montelukast) 10mg  daily  - Add on: Atrovent nasal spray one spray per nostril up to four times daily as needed  (can be overdrying). - You can use nasal saline gel to help keep the nasal passages moist.   3. Possible contact dermatitis (fragrance mix, paraben mix, gold, formaldehyde, Stress away essential oil) - Continue to avoid all of your triggers. - If you are tolerating a certain cosmetic without any problems, just keep it.    4. Return in about 6 months (around 12/05/2020).    Subjective:   Madaleine Simmon is a 44 y.o. female presenting today for follow up of  Chief Complaint  Patient presents with  . Allergic Rhinitis     Some days are good and some are bad. Has to take extra allergy medication at times for breakthrough symptoms     Mirissa Lopresti has a history of the following: Patient Active Problem List   Diagnosis Date Noted  . Allergic reaction 03/04/2020  . Extrinsic asthma 03/04/2020  . Seasonal and perennial allergic rhinitis 03/04/2020  . Allergic contact dermatitis 03/04/2020    History obtained from: chart review and patient.  Patrena is a 44 y.o. female presenting for a follow up visit.  She has a history of allergic reactions and is actually undergone patch testing as well as a multitude of labs.  Nothing is really been found, but she does have an epinephrine injector in place.  Since last visit, she has done well.  She did have a sleep study and had issues with the tape that was used.  Thanks to the patch testing for last  visit, we have been able to take certain triggers out of her life.  Her rash on her right lower extremity is completely resolved.  The rash on her face is nonexistent at this point.  She has been avoiding her oil, which she was using on her skin.  This was a mix of several different essential oils, as shown below.  She had a reaction to another blend of oils.  Both of these contain lavender, so she thinks this is one of her main triggers.     Terris's asthma has been well controlled. She has not required rescue medication, experienced nocturnal  awakenings due to lower respiratory symptoms, nor have activities of daily living been limited. She has required no Emergency Department or Urgent Care visits for her asthma. She has required zero courses of systemic steroids for asthma exacerbations since the last visit. ACT score today is 25, indicating excellent asthma symptom control.   She does have constant postnasal drip despite the antihistamines.  She uses both Singulair and Xyzal.  She also has Clarinex added during particularly bad flares.  She has used no spray, but they caused nosebleeds.  She has never tried Atrovent.  Otherwise, there have been no changes to her past medical history, surgical history, family history, or social history.    Review of Systems  Constitutional: Negative.  Negative for fever, malaise/fatigue and weight loss.  HENT: Negative.  Negative for congestion, ear discharge and ear pain.   Eyes: Negative for pain, discharge and redness.  Respiratory: Negative for cough, sputum production, shortness of breath and wheezing.   Cardiovascular: Negative.  Negative for chest pain and palpitations.  Gastrointestinal: Negative for abdominal pain, heartburn, nausea and vomiting.  Skin: Negative.  Negative for itching and rash.  Neurological: Negative for dizziness and headaches.  Endo/Heme/Allergies: Negative for environmental allergies. Does not bruise/bleed easily.       Objective:   Blood pressure 132/88, pulse 71, temperature 97.9 F (36.6 C), resp. rate 14, height 5' 10.5" (1.791 m), weight 256 lb 12.8 oz (116.5 kg), SpO2 99 %. Body mass index is 36.33 kg/m.   Physical Exam:  Physical Exam Constitutional:      Appearance: She is well-developed.     Comments: Talkative female. Red and black hair.  HENT:     Head: Normocephalic and atraumatic.     Right Ear: Tympanic membrane, ear canal and external ear normal.     Left Ear: Tympanic membrane, ear canal and external ear normal.     Nose: No nasal  deformity, septal deviation, mucosal edema, rhinorrhea or epistaxis.     Right Turbinates: Enlarged.     Left Turbinates: Enlarged.     Right Sinus: No maxillary sinus tenderness or frontal sinus tenderness.     Left Sinus: No maxillary sinus tenderness or frontal sinus tenderness.     Mouth/Throat:     Mouth: Oropharynx is clear and moist. Mucous membranes are not pale and not dry.     Pharynx: Uvula midline.  Eyes:     General:        Right eye: No discharge.        Left eye: No discharge.     Extraocular Movements: EOM normal.     Conjunctiva/sclera: Conjunctivae normal.     Right eye: Right conjunctiva is not injected. No chemosis.    Left eye: Left conjunctiva is not injected. No chemosis.    Pupils: Pupils are equal, round, and reactive to light.  Cardiovascular:  Rate and Rhythm: Normal rate and regular rhythm.     Heart sounds: Normal heart sounds.  Pulmonary:     Effort: Pulmonary effort is normal. No tachypnea, accessory muscle usage or respiratory distress.     Breath sounds: Normal breath sounds. No wheezing, rhonchi or rales.     Comments: Moving air well in all lung fields. Chest:     Chest wall: No tenderness.  Lymphadenopathy:     Cervical: No cervical adenopathy.  Skin:    Coloration: Skin is not pale.     Findings: No abrasion, erythema, petechiae or rash. Rash is not papular, urticarial or vesicular.  Neurological:     Mental Status: She is alert.  Psychiatric:        Mood and Affect: Mood and affect normal.        Behavior: Behavior is cooperative.      Diagnostic studies: none      Malachi Bonds, MD  [facility]

## 2020-08-08 ENCOUNTER — Other Ambulatory Visit: Payer: Self-pay | Admitting: Allergy & Immunology

## 2020-09-16 LAB — HM MAMMOGRAPHY

## 2020-12-05 LAB — HM COLONOSCOPY

## 2020-12-24 ENCOUNTER — Ambulatory Visit: Payer: Self-pay | Admitting: Allergy & Immunology

## 2020-12-30 ENCOUNTER — Other Ambulatory Visit: Payer: Self-pay

## 2020-12-30 ENCOUNTER — Ambulatory Visit: Payer: 59 | Admitting: Allergy & Immunology

## 2020-12-30 ENCOUNTER — Encounter: Payer: Self-pay | Admitting: Allergy & Immunology

## 2020-12-30 VITALS — BP 130/80 | HR 70 | Temp 98.1°F | Resp 16

## 2020-12-30 DIAGNOSIS — L239 Allergic contact dermatitis, unspecified cause: Secondary | ICD-10-CM

## 2020-12-30 DIAGNOSIS — J4520 Mild intermittent asthma, uncomplicated: Secondary | ICD-10-CM

## 2020-12-30 DIAGNOSIS — J3089 Other allergic rhinitis: Secondary | ICD-10-CM | POA: Diagnosis not present

## 2020-12-30 DIAGNOSIS — J302 Other seasonal allergic rhinitis: Secondary | ICD-10-CM | POA: Diagnosis not present

## 2020-12-30 DIAGNOSIS — T7840XD Allergy, unspecified, subsequent encounter: Secondary | ICD-10-CM

## 2020-12-30 MED ORDER — CARBINOXAMINE MALEATE 6 MG PO TABS: 1.0000 | ORAL_TABLET | Freq: Two times a day (BID) | ORAL | 5 refills | Status: DC

## 2020-12-30 NOTE — Patient Instructions (Addendum)
1. Allergic reaction - unknown trigger - I have no idea what is going on with you.  Caroline Gill is up to date at least.  2. Seasonal and perennial allergic rhinitis (grasses, ragweed, weeds, trees and dog) - Continue with: Xyzal (levocetirizine) 5mg  tablet 1-2 times daily and Singulair (montelukast) 10mg  daily  - Add on: RyVent 6mg  twice daily as needed (THIS CAN CAUSE SLEEPINESS).  3. Possible contact dermatitis (fragrance mix, paraben mix, gold, formaldehyde, Stress away essential oil) - Continue to avoid all of your triggers.  4. Return in about 1 year (around 12/30/2021).    Please inform of any Emergency Department visits, hospitalizations, or changes in symptoms. Call before going to the ED for breathing or allergy symptoms since we might be able to fit you in for a sick visit. Feel free to contact 01/01/2022 anytime with any questions, problems, or concerns.  It was a pleasure to see you again today!  Websites that have reliable patient information: 1. American Academy of Asthma, Allergy, and Immunology: www.aaaai.org 2. Food Allergy Research and Education (FARE): foodallergy.org 3. Mothers of Asthmatics: http://www.asthmacommunitynetwork.org 4. American College of Allergy, Asthma, and Immunology: www.acaai.org   COVID-19 Vaccine Information can be found at: [place] For questions related to vaccine distribution or appointments, please email [email or web address] or call [phone].   We realize that you might be concerned about having an allergic reaction to the COVID19 vaccines. To help with that concern, WE ARE OFFERING THE COVID19 VACCINES IN OUR OFFICE! Ask the front desk for dates!     "Like" [place] on Facebook and Instagram for our latest updates!      A healthy democracy works best when [email or web address] participate! Make sure you are registered to vote! If you have moved or changed any of your contact  information, you will need to get this updated before voting!  In some cases, you MAY be able to register to vote online: [phone]

## 2020-12-30 NOTE — Progress Notes (Signed)
FOLLOW UP  Date of Service/Encounter:  12/30/20   Assessment:   Allergic reaction - unclear reaction   Seasonal and perennial allergic rhinitis (grasses, weeds, ragweed, trees, dog) - was on allergen immunotherapy in the past with multiple large local reactions   Possible contact dermatitis (fragrance mix, paraben mix, gold, formaldehyde, Stress away essential oil)   Elevated CRP - normalized upon repeat  Neutrophilia - followed by Hematology/Oncology (Dr. Neil Crouch)  Supraventricular tachycardia   Caroline Gill continues to do well.  We still have no idea why Caroline Gill was having these reactions, but reassuringly Caroline Gill has not had any since I started following her.  The daily use of the Xyzal might of helped, but I prefer that Caroline Gill keep her Auvi-Q up-to-date anyway.  Caroline Gill seems to have found some cosmetics at work for her.  Caroline Gill has not gone to make any changes.  We will see her again in 1 year or earlier if needed.  Plan/Recommendations:    1. Allergic reaction - unknown trigger - I have no idea what is going on with you.  Caroline Gill is up to date at least.  2. Seasonal and perennial allergic rhinitis (grasses, ragweed, weeds, trees and dog) - Continue with: Xyzal (levocetirizine) 5mg  tablet 1-2 times daily and Singulair (montelukast) 10mg  daily  - Add on: RyVent 6mg  twice daily as needed (THIS CAN CAUSE SLEEPINESS).  3. Possible contact dermatitis (fragrance mix, paraben mix, gold, formaldehyde, Stress away essential oil) - Continue to avoid all of your triggers.  4. Return in about 1 year (around 12/30/2021).   Subjective:   Caroline Gill is a 45 y.o. female presenting today for follow up of  Chief Complaint  Patient presents with   Allergic Reaction    Thinks Caroline Gill had a reaction [place]    Caroline Gill has a history of the following: Patient Active Problem List   Diagnosis Date Noted   Allergic reaction 03/04/2020   Extrinsic asthma 03/04/2020    Seasonal and perennial allergic rhinitis 03/04/2020   Allergic contact dermatitis 03/04/2020    History obtained from: chart review and patient.  Caroline Gill is a 45 y.o. female presenting for a follow up visit.  Caroline Gill was last seen in December 2021.  At that time, we made sure that her epinephrine autoinjector was up-to-date.  For her allergic rhinitis, we continue with Xyzal as well as Singulair.  We did add on Atrovent for rhinorrhea.  For her contact dermatitis, we recommended continued avoidance.  Since last visit, Caroline Gill has done well. Caroline Gill got back from the beach on Thursday. Caroline Gill did not get sunburned at all. Caroline Gill did react to the sheets at her condo. Caroline Gill reports that Caroline Gill has having itchiness and redness. Caroline Gill took her extra Xyzal and Caroline Gill used Benadryl cream. It always got worse. Caroline Gill traded out her pillow with her daughters and that seemed to help. It was not anything Caroline Gill was eating.   Caroline Gill has not needed her AuviQ at all. Caroline Gill did quit sugar for one month. Caroline Gill was eating fresh fruit more than anything. This overall seemed to help. Se did lose a fair amount of weight and is hoping to lose more.   Caroline Gill was recently diagnosed with SVT and is not being treated because her blood pressure is so low. They are just monitoring it right now. Caroline Gill has an elevated white blood count again and Caroline Gill is following with Hematology.  Caroline Gill is following back up in  4 months.  Her last CBC showed a white blood count of 13.9 with 77% neutrophils.  There was a smear ordered, but I do not see the results of that.  Otherwise, there have been no changes to her past medical history, surgical history, family history, or social history.    Review of Systems  Constitutional: Negative.  Negative for chills, fever, malaise/fatigue and weight loss.  HENT: Negative.  Negative for congestion, ear discharge and ear pain.   Eyes:  Negative for pain, discharge and redness.  Respiratory:  Negative for cough, sputum production, shortness of  breath and wheezing.   Cardiovascular: Negative.  Negative for chest pain and palpitations.  Gastrointestinal:  Negative for abdominal pain, heartburn, nausea and vomiting.  Skin:  Positive for itching and rash.  Neurological:  Negative for dizziness and headaches.  Endo/Heme/Allergies:  Negative for environmental allergies. Does not bruise/bleed easily.      Objective:   Blood pressure 130/80, pulse 70, temperature 98.1 F (36.7 C), temperature source Temporal, resp. rate 16, SpO2 98 %. There is no height or weight on file to calculate BMI.   Physical Exam:  Physical Exam Constitutional:      Appearance: Caroline Gill is well-developed.  HENT:     Head: Normocephalic and atraumatic.     Right Ear: Tympanic membrane, ear canal and external ear normal.     Left Ear: Tympanic membrane, ear canal and external ear normal.     Nose: No nasal deformity, septal deviation, mucosal edema or rhinorrhea.     Right Turbinates: Enlarged and swollen.     Left Turbinates: Enlarged and swollen.     Right Sinus: No maxillary sinus tenderness or frontal sinus tenderness.     Left Sinus: No maxillary sinus tenderness or frontal sinus tenderness.     Comments: No nasal polyps noted.  Turbinates erythematous.  No epistaxis.    Mouth/Throat:     Mouth: Mucous membranes are not pale and not dry.     Pharynx: Uvula midline.  Eyes:     General: Lids are normal. No allergic shiner.       Right eye: No discharge.        Left eye: No discharge.     Conjunctiva/sclera: Conjunctivae normal.     Right eye: Right conjunctiva is not injected. No chemosis.    Left eye: Left conjunctiva is not injected. No chemosis.    Pupils: Pupils are equal, round, and reactive to light.  Cardiovascular:     Rate and Rhythm: Normal rate and regular rhythm.     Heart sounds: Normal heart sounds.  Pulmonary:     Effort: Pulmonary effort is normal. No tachypnea, accessory muscle usage or respiratory distress.     Breath sounds:  Normal breath sounds. No wheezing, rhonchi or rales.     Comments: Moving air well in all lung fields.  No increased work of breathing. Chest:     Chest wall: No tenderness.  Lymphadenopathy:     Cervical: No cervical adenopathy.  Skin:    General: Skin is warm.     Capillary Refill: Capillary refill takes less than 2 seconds.     Coloration: Skin is not pale.     Findings: No abrasion, erythema, petechiae or rash. Rash is not papular, urticarial or vesicular.     Comments: Faint erythema, but overall fairly clear skin.  Caroline Gill does have an both of her hands and arms.  Neurological:     Mental Status: Caroline Gill is alert.  Psychiatric:        Behavior: Behavior is cooperative.     Diagnostic studies: none       Salvatore Marvel, MD  [facility] [place]

## 2020-12-31 ENCOUNTER — Telehealth: Payer: Self-pay | Admitting: Allergy & Immunology

## 2020-12-31 NOTE — Telephone Encounter (Signed)
Faxed last note from 12/30/20 to [facility] pharmacy [phone]

## 2020-12-31 NOTE — Telephone Encounter (Signed)
Needs last office progress notes faxed over [phone]

## 2021-01-01 ENCOUNTER — Other Ambulatory Visit: Payer: Self-pay

## 2021-01-01 MED ORDER — CARBINOXAMINE MALEATE 4 MG PO TABS: ORAL_TABLET | ORAL | 5 refills | Status: DC

## 2021-01-01 NOTE — Telephone Encounter (Signed)
Changed ryvent 6 mg to carbinoxamine 4 mg left message on pt.'s voice mail of the medication change.

## 2021-01-09 NOTE — Telephone Encounter (Signed)
Patient aware of medication change and has picked it up from the pharmacy. Will start it over the weekend because she's not sure if it will make her drowsy or not.

## 2021-01-13 ENCOUNTER — Encounter: Payer: Self-pay | Admitting: Allergy & Immunology

## 2021-01-27 ENCOUNTER — Other Ambulatory Visit: Payer: Self-pay | Admitting: Allergy & Immunology

## 2021-07-15 ENCOUNTER — Other Ambulatory Visit: Payer: Self-pay | Admitting: Allergy & Immunology

## 2021-07-29 ENCOUNTER — Other Ambulatory Visit: Payer: Self-pay

## 2021-07-29 MED ORDER — EPINEPHRINE 0.3 MG/0.3ML IJ SOAJ: 0.3000 mg | INTRAMUSCULAR | 1 refills | Status: DC | PRN

## 2021-09-08 ENCOUNTER — Other Ambulatory Visit: Payer: Self-pay | Admitting: Allergy & Immunology

## 2021-10-01 LAB — HM PAP SMEAR

## 2021-10-06 ENCOUNTER — Other Ambulatory Visit: Payer: Self-pay | Admitting: Allergy & Immunology

## 2021-11-07 ENCOUNTER — Emergency Department
Admission: RE | Admit: 2021-11-07 | Discharge: 2021-11-07 | Disposition: A | Discharge status: Home / Self Care | Payer: 59 | Source: Ambulatory Visit | Attending: Family Medicine | Admitting: Family Medicine

## 2021-11-07 ENCOUNTER — Other Ambulatory Visit: Payer: Self-pay

## 2021-11-07 VITALS — BP 120/78 | HR 78 | Temp 97.8°F | Resp 16 | Ht 70.0 in | Wt 250.0 lb

## 2021-11-07 DIAGNOSIS — J069 Acute upper respiratory infection, unspecified: Secondary | ICD-10-CM

## 2021-11-07 DIAGNOSIS — J3089 Other allergic rhinitis: Secondary | ICD-10-CM

## 2021-11-07 DIAGNOSIS — J4520 Mild intermittent asthma, uncomplicated: Secondary | ICD-10-CM

## 2021-11-07 MED ORDER — PREDNISONE 20 MG PO TABS: ORAL_TABLET | ORAL | 0 refills | Status: DC

## 2021-11-07 NOTE — Discharge Instructions (Signed)
Take plain guaifenesin (1200mg  extended release tabs such as Mucinex) twice daily, with plenty of water, for cough and congestion.  May add Pseudoephedrine (30mg , one or two every 4 to 6 hours) for sinus congestion.  Get adequate rest.   May use Afrin nasal spray (or generic oxymetazoline) each morning for about 5 days and then discontinue.  Also recommend using saline nasal spray several times daily and saline nasal irrigation (AYR is a common brand).  Use Flonase nasal spray each morning after using Afrin nasal spray and saline nasal irrigation. May take Delsym Cough Suppressant ("12 Hour Cough Relief") at bedtime for nighttime cough.  Stop all antihistamines (Xyzal, Nyquil, etc) for now, and other non-prescription cough/cold preparations.  May continue Singulair. Continue albuterol and Symbicort inhalers.

## 2021-11-07 NOTE — ED Provider Notes (Signed)
[facility]    CSN: [id number] Arrival date & time: 11/07/21  1015      History   Chief Complaint Chief Complaint  Patient presents with   Nasal Congestion    Been sick for a week and it is now turning green - Entered by patient    HPI Caroline Gill is a 46 y.o. female.   Two weeks ago patient while travelling patient suddenly experienced wheezing and increased nasal congestion which she attributed to her allergic rhinitis.  She developed some wheezing necessitating the use of her albuterol and Symbicort inhalers.  One week ago she began to feel sick with onset of fatigue, myalgias, and increased cough/wheezing.  She denies pleuritic pain and fever.  Her cough is generally non-productive and worse at night.  She has had two negative home COVID tests.  The history is provided by the patient.   History reviewed. No pertinent past medical history.  Patient Active Problem List   Diagnosis Date Noted   Allergic reaction 03/04/2020   Extrinsic asthma 03/04/2020   Seasonal and perennial allergic rhinitis 03/04/2020   Allergic contact dermatitis 03/04/2020    Past Surgical History:  Procedure Laterality Date   GALLBLADDER SURGERY     KNEE SURGERY Bilateral    SHOULDER ARTHROSCOPY      OB History   No obstetric history on file.      Home Medications    Prior to Admission medications   Medication Sig Start Date End Date Taking? Authorizing Provider  predniSONE (DELTASONE) 20 MG tablet Take one tab by mouth twice daily for 4 days, then one daily for 3 days. Take with food. 11/07/21  Yes Lattie Haw, MD  albuterol (VENTOLIN HFA) 108 (90 Base) MCG/ACT inhaler Inhale 2 puffs into the lungs every 6 (six) hours as needed for wheezing or shortness of breath. 06/06/20   Alfonse Spruce, MD  Carbinoxamine Maleate 4 MG TABS Take one tablet every 8 hours for runny nose 01/01/21   Alfonse Spruce, MD  EPINEPHrine (AUVI-Q) 0.3 mg/0.3 mL IJ SOAJ injection  Inject 0.3 mg into the muscle as needed for anaphylaxis. 07/29/21   Alfonse Spruce, MD  ibuprofen (ADVIL) 800 MG tablet Take 800 mg by mouth 3 (three) times daily. 10/09/20   [provider]  levocetirizine (XYZAL) 5 MG tablet Take 1 tablet (5 mg total) by mouth daily. 10/06/21   Alfonse Spruce, MD  montelukast (SINGULAIR) 10 MG tablet Take 1 tablet (10 mg total) by mouth at bedtime. 09/08/21   Alfonse Spruce, MD  SYMBICORT 160-4.5 MCG/ACT inhaler Inhale 2 puffs into the lungs 2 (two) times daily. 03/11/20   [provider]  triamcinolone ointment (KENALOG) 0.1 % Apply 1 application topically 2 (two) times daily as needed. 03/25/20   Alfonse Spruce, MD    Family History Family History  Problem Relation Age of Onset   Thyroid disease Mother    Diabetes Mother    Stroke Mother    Atrial fibrillation Mother    Diabetes Father    Atrial fibrillation Father    Stroke Father    Asthma Brother    Angioedema Neg Hx    Allergic rhinitis Neg Hx    Immunodeficiency Neg Hx    Urticaria Neg Hx    Eczema Neg Hx     Social History Social History   Tobacco Use   Smoking status: Never   Smokeless tobacco: Never  Vaping Use   Vaping Use:  Never used  Substance Use Topics   Alcohol use: Yes    Comment: occ   Drug use: Never     Allergies   Oxycodone and Tapentadol   Review of Systems Review of Systems No sore throat + cough No pleuritic pain + wheezing + nasal congestion + post-nasal drainage No sinus pain/pressure No itchy/red eyes No earache No hemoptysis No SOB No fever/chills No nausea No vomiting No abdominal pain No diarrhea No urinary symptoms No skin rash + fatigue + myalgias No headache Used OTC meds (Xyzal, Nyquil, Mucinex, AlkaSeltzer) without relief   Physical Exam Triage Vital Signs ED Triage Vitals  Enc Vitals Group     BP 11/07/21 1032 120/78     Pulse Rate 11/07/21 1032 78     Resp 11/07/21 1032 16      Temp 11/07/21 1032 97.8 F (36.6 C)     Temp Source 11/07/21 1032 Oral     SpO2 11/07/21 1032 98 %     Weight 11/07/21 1033 250 lb (113.4 kg)     Height 11/07/21 1033 5\' 10"  (1.778 m)     Head Circumference --      Peak Flow --      Pain Score 11/07/21 1033 0     Pain Loc --      Pain Edu? --      Excl. in GC? --    No data found.  Updated Vital Signs BP 120/78 (BP Location: Left Arm)   Pulse 78   Temp 97.8 F (36.6 C) (Oral)   Resp 16   Ht 5\' 10"  (1.778 m)   Wt 113.4 kg   SpO2 98%   BMI 35.87 kg/m   Visual Acuity Right Eye Distance:   Left Eye Distance:   Bilateral Distance:    Right Eye Near:   Left Eye Near:    Bilateral Near:     Physical Exam Nursing notes and Vital Signs reviewed. Appearance:  Patient appears stated age, and in no acute distress Eyes:  Pupils are equal, round, and reactive to light and accomodation.  Extraocular movement is intact.  Conjunctivae are not inflamed  Ears:  Canals normal.  Tympanic membranes normal.  Nose:  Mildly congested turbinates.  No sinus tenderness.  Pharynx:  Normal Neck:  Supple.  Mildly enlarged lateral nodes are present, tender to palpation on the left.   Lungs:  Clear to auscultation.  Breath sounds are equal.  Moving air well. Heart:  Regular rate and rhythm without murmurs, rubs, or gallops.  Abdomen:  Nontender without masses or hepatosplenomegaly.  Bowel sounds are present.  No CVA or flank tenderness.  Extremities:  No edema.  Skin:  No rash present.   UC Treatments / Results  Labs (all labs ordered are listed, but only abnormal results are displayed) Labs Reviewed - No data to display  EKG   Radiology No results found.  Procedures Procedures (including critical care time)  Medications Ordered in UC Medications - No data to display  Initial Impression / Assessment and Plan / UC Course  I have reviewed the triage vital signs and the nursing notes.  Pertinent labs & imaging results that were  available during my care of the patient were reviewed by me and considered in my medical decision making (see chart for details).    Benign exam.  There is no evidence of bacterial infection today.   Because of her allergic rhinitis and mild asthma will begin prednisone burst/taper. Followup with  Family Doctor if not improved in one week.   Final Clinical Impressions(s) / UC Diagnoses   Final diagnoses:  Viral URI with cough  Perennial allergic rhinitis  Mild intermittent asthma without complication     Discharge Instructions      Take plain guaifenesin (1200mg  extended release tabs such as Mucinex) twice daily, with plenty of water, for cough and congestion.  May add Pseudoephedrine (30mg , one or two every 4 to 6 hours) for sinus congestion.  Get adequate rest.   May use Afrin nasal spray (or generic oxymetazoline) each morning for about 5 days and then discontinue.  Also recommend using saline nasal spray several times daily and saline nasal irrigation (AYR is a common brand).  Use Flonase nasal spray each morning after using Afrin nasal spray and saline nasal irrigation. May take Delsym Cough Suppressant ("12 Hour Cough Relief") at bedtime for nighttime cough.  Stop all antihistamines (Xyzal, Nyquil, etc) for now, and other non-prescription cough/cold preparations.  May continue Singulair. Continue albuterol and Symbicort inhalers.      ED Prescriptions     Medication Sig Dispense Auth. Provider   predniSONE (DELTASONE) 20 MG tablet Take one tab by mouth twice daily for 4 days, then one daily for 3 days. Take with food. 11 tablet , MD         , MD 11/07/21 [phone]

## 2021-11-07 NOTE — ED Triage Notes (Addendum)
Nasal congestion x 10 days, 2 negative Covid tests. Vaccinated

## 2021-11-17 ENCOUNTER — Emergency Department (INDEPENDENT_AMBULATORY_CARE_PROVIDER_SITE_OTHER)
Admission: RE | Admit: 2021-11-17 | Discharge: 2021-11-17 | Disposition: A | Discharge status: Home / Self Care | Payer: 59 | Source: Ambulatory Visit

## 2021-11-17 VITALS — BP 127/84 | HR 78 | Temp 99.6°F | Resp 17 | Ht 70.0 in | Wt 245.0 lb

## 2021-11-17 DIAGNOSIS — R059 Cough, unspecified: Secondary | ICD-10-CM

## 2021-11-17 DIAGNOSIS — J019 Acute sinusitis, unspecified: Secondary | ICD-10-CM

## 2021-11-17 MED ORDER — PROMETHAZINE-DM 6.25-15 MG/5ML PO SYRP: 5.0000 mL | ORAL_SOLUTION | Freq: Three times a day (TID) | ORAL | 0 refills | Status: DC | PRN

## 2021-11-17 MED ORDER — AMOXICILLIN 875 MG PO TABS: 875.0000 mg | ORAL_TABLET | Freq: Two times a day (BID) | ORAL | 0 refills | Status: DC

## 2021-11-17 NOTE — ED Triage Notes (Signed)
Took steroids  Cough came back on Saturday  Dry & hacking  No tylenol or motrin today  Night  sweats the last couple weeks - attributed to steroids per pt

## 2021-11-17 NOTE — ED Provider Notes (Signed)
[facility]    CSN: [id number] Arrival date & time: 11/17/21  1347      History   Chief Complaint Chief Complaint  Patient presents with   Cough    Entered by patient    HPI Caroline Gill is a 46 y.o. female.   HPI Patient presents today with cough. Patient seen 11/07/21, prescribed prednisone for acute asthma exacerbation and Viral URI. She reports taking the entire 7 day course of prednisone however, cough returned two days ago. Endorses cough is dry and hacking.  She endorses some increased work of breathing with coughing.  No wheezing.  She has not consistently been using her Symbicort as she typically only uses if she is having any symptoms of shortness of breath.  She has remained afebrile.  History reviewed. No pertinent past medical history.  Patient Active Problem List   Diagnosis Date Noted   Allergic reaction 03/04/2020   Extrinsic asthma 03/04/2020   Seasonal and perennial allergic rhinitis 03/04/2020   Allergic contact dermatitis 03/04/2020    Past Surgical History:  Procedure Laterality Date   GALLBLADDER SURGERY     KNEE SURGERY Bilateral    SHOULDER ARTHROSCOPY      OB History   No obstetric history on file.      Home Medications    Prior to Admission medications   Medication Sig Start Date End Date Taking? Authorizing Provider  amoxicillin (AMOXIL) 875 MG tablet Take 1 tablet (875 mg total) by mouth 2 (two) times daily. 11/17/21  Yes Bing Neighbors, FNP  promethazine-dextromethorphan (PROMETHAZINE-DM) 6.25-15 MG/5ML syrup Take 5 mLs by mouth 3 (three) times daily as needed for cough. 11/17/21  Yes Bing Neighbors, FNP  albuterol (VENTOLIN HFA) 108 (90 Base) MCG/ACT inhaler Inhale 2 puffs into the lungs every 6 (six) hours as needed for wheezing or shortness of breath. 06/06/20   Alfonse Spruce, MD  Carbinoxamine Maleate 4 MG TABS Take one tablet every 8 hours for runny nose 01/01/21   Alfonse Spruce, MD  EPINEPHrine  (AUVI-Q) 0.3 mg/0.3 mL IJ SOAJ injection Inject 0.3 mg into the muscle as needed for anaphylaxis. 07/29/21   Alfonse Spruce, MD  ibuprofen (ADVIL) 800 MG tablet Take 800 mg by mouth 3 (three) times daily. 10/09/20   [provider]  levocetirizine (XYZAL) 5 MG tablet Take 1 tablet (5 mg total) by mouth daily. 10/06/21   Alfonse Spruce, MD  montelukast (SINGULAIR) 10 MG tablet Take 1 tablet (10 mg total) by mouth at bedtime. 09/08/21   Alfonse Spruce, MD  predniSONE (DELTASONE) 20 MG tablet Take one tab by mouth twice daily for 4 days, then one daily for 3 days. Take with food. Patient not taking: Reported on 11/17/2021 11/07/21   Lattie Haw, MD  SYMBICORT 160-4.5 MCG/ACT inhaler Inhale 2 puffs into the lungs 2 (two) times daily. 03/11/20   [provider]  triamcinolone ointment (KENALOG) 0.1 % Apply 1 application topically 2 (two) times daily as needed. 03/25/20   Alfonse Spruce, MD    Family History Family History  Problem Relation Age of Onset   Thyroid disease Mother    Diabetes Mother    Stroke Mother    Atrial fibrillation Mother    Diabetes Father    Atrial fibrillation Father    Stroke Father    Asthma Brother    Angioedema Neg Hx    Allergic rhinitis Neg Hx    Immunodeficiency Neg Hx  Urticaria Neg Hx    Eczema Neg Hx     Social History Social History   Tobacco Use   Smoking status: Never    Passive exposure: Never   Smokeless tobacco: Never  Vaping Use   Vaping Use: Never used  Substance Use Topics   Alcohol use: Not Currently   Drug use: Never     Allergies   Oxycodone and Tapentadol   Review of Systems Review of Systems Pertinent negatives listed in HPI   Physical Exam Triage Vital Signs ED Triage Vitals  Enc Vitals Group     BP 11/17/21 1423 127/84     Pulse Rate 11/17/21 1423 78     Resp 11/17/21 1423 17     Temp 11/17/21 1423 99.6 F (37.6 C)     Temp Source 11/17/21 1423 Oral     SpO2 11/17/21  1423 97 %     Weight 11/17/21 1425 245 lb (111.1 kg)     Height 11/17/21 1425 5\' 10"  (1.778 m)     Head Circumference --      Peak Flow --      Pain Score 11/17/21 1424 3     Pain Loc --      Pain Edu? --      Excl. in GC? --    No data found.  Updated Vital Signs BP 127/84 (BP Location: Left Arm)   Pulse 78   Temp 99.6 F (37.6 C) (Oral)   Resp 17   Ht 5\' 10"  (1.778 m)   Wt 245 lb (111.1 kg)   SpO2 97%   BMI 35.15 kg/m   Visual Acuity Right Eye Distance:   Left Eye Distance:   Bilateral Distance:    Right Eye Near:   Left Eye Near:    Bilateral Near:     Physical Exam  General Appearance:    Alert, cooperative, no distress  HENT:   Normocephalic, ears normal, nares mucosal edema with congestion, rhinorrhea, oropharynx without erythema or exudate   Eyes:    PERRL, conjunctiva/corneas clear, EOM's intact       Lungs:     Coarse lung sounds generalized, no wheezing, respirations unlabored  Heart:    Regular rate and rhythm  Neurologic:   Awake, alert, oriented x 3. No apparent focal neurological           defect.      UC Treatments / Results  Labs (all labs ordered are listed, but only abnormal results are displayed) Labs Reviewed - No data to display  EKG   Radiology No results found.  Procedures Procedures (including critical care time)  Medications Ordered in UC Medications - No data to display  Initial Impression / Assessment and Plan / UC Course  I have reviewed the triage vital signs and the nursing notes.  Pertinent labs & imaging results that were available during my care of the patient were reviewed by me and considered in my medical decision making (see chart for details).    Acute non -recurrent sinusitis  and with cough Resume Symbicort  Start amoxicillin and PRN promethazine DM for cough. Strict return precautions given if symptoms worsen or do not improve. Final Clinical Impressions(s) / UC Diagnoses   Final diagnoses:  Acute  non-recurrent sinusitis, unspecified location  Cough, unspecified type     Discharge Instructions      Resume Symbicort daily. Take entire course of antibiotics.  Hydrate well with fluids.  ED Prescriptions     Medication Sig Dispense Auth. Provider   promethazine-dextromethorphan (PROMETHAZINE-DM) 6.25-15 MG/5ML syrup Take 5 mLs by mouth 3 (three) times daily as needed for cough. 140 mL Bing NeighborsHarris, Yoniel Arkwright S, FNP   amoxicillin (AMOXIL) 875 MG tablet Take 1 tablet (875 mg total) by mouth 2 (two) times daily. 20 tablet Bing NeighborsHarris, Adanna Zuckerman S, FNP      PDMP not reviewed this encounter.   Bing NeighborsHarris, Isiaha Greenup S, [place]FNP 11/21/21 [phone]

## 2021-11-17 NOTE — Discharge Instructions (Addendum)
Resume Symbicort daily. Take entire course of antibiotics.  Hydrate well with fluids.

## 2021-11-24 ENCOUNTER — Ambulatory Visit: Payer: 59 | Admitting: Internal Medicine

## 2021-11-24 ENCOUNTER — Encounter: Payer: Self-pay | Admitting: Internal Medicine

## 2021-11-24 ENCOUNTER — Other Ambulatory Visit: Payer: Self-pay | Admitting: Internal Medicine

## 2021-11-24 ENCOUNTER — Ambulatory Visit (HOSPITAL_BASED_OUTPATIENT_CLINIC_OR_DEPARTMENT_OTHER)
Admission: RE | Admit: 2021-11-24 | Discharge: 2021-11-24 | Disposition: A | Discharge status: Home / Self Care | Payer: 59 | Source: Ambulatory Visit | Attending: Internal Medicine | Admitting: Internal Medicine

## 2021-11-24 VITALS — BP 122/70 | HR 82 | Temp 97.3°F | Resp 16 | Ht 70.5 in | Wt 249.2 lb

## 2021-11-24 DIAGNOSIS — R052 Subacute cough: Secondary | ICD-10-CM

## 2021-11-24 DIAGNOSIS — J4520 Mild intermittent asthma, uncomplicated: Secondary | ICD-10-CM | POA: Diagnosis not present

## 2021-11-24 DIAGNOSIS — J3089 Other allergic rhinitis: Secondary | ICD-10-CM | POA: Diagnosis not present

## 2021-11-24 MED ORDER — AZITHROMYCIN 250 MG PO TABS: ORAL_TABLET | ORAL | 0 refills | Status: DC

## 2021-11-24 MED ORDER — SYMBICORT 160-4.5 MCG/ACT IN AERO
2.0000 | INHALATION_SPRAY | Freq: Two times a day (BID) | RESPIRATORY_TRACT | 6 refills | Status: DC
Start: 2021-11-24 — End: 2022-04-06

## 2021-11-24 MED ORDER — ALBUTEROL SULFATE HFA 108 (90 BASE) MCG/ACT IN AERS
2.0000 | INHALATION_SPRAY | Freq: Four times a day (QID) | RESPIRATORY_TRACT | 3 refills | Status: DC | PRN
Start: 2021-11-24 — End: 2023-07-01

## 2021-11-24 MED ORDER — MONTELUKAST SODIUM 10 MG PO TABS
10.0000 mg | ORAL_TABLET | Freq: Every day | ORAL | 3 refills | Status: DC
Start: 2021-11-24 — End: 2022-11-27

## 2021-11-24 MED ORDER — LEVOCETIRIZINE DIHYDROCHLORIDE 5 MG PO TABS: 5.0000 mg | ORAL_TABLET | Freq: Every day | ORAL | 3 refills | Status: DC

## 2021-11-24 NOTE — Progress Notes (Signed)
FOLLOW UP Date of Service/Encounter:  11/24/21   Subjective:  Caroline Gill (DOB: 10/01/1975) is a 46 y.o. female who [facility] in re-evaluation of the following:  History obtained from: chart review and patient.  For Review, LV was on 12/30/20  with Dr. Dellis Anes seen for routine follow-up for allergic rhinitis, contact dermatitis and allergic reactions. Continued on Xyzal and Singulair for rhinitis with Atrovent as needed.    Today presents for follow-up. May 18th started feeling a little bad, took a Covid test and it was negative. Was in [place] when this started.  However, when she came back, cough continued. She was inially evaluated at an UC- treated with steroids, Mucinex, sudafed-steroids helped, cough calmed down.  Within 2-3 days of stopping steroids, cough returned. .  Last Monday she was again evaluated at an [facility] June 5th started antibiotics and Rx cough medicine.  This is not improving her symptoms. She is starting to feel worse.  She was prescribed amoxicillin and has 4 days left. She is not wheezing, does feel like she has a heavy chest/lungs hurt.    She does have allergic asthma, but has never had symptoms like this. She is not sure if this is asthma.  Spring is when her asthma is typically bad, and this is when she will start her Symbicort which she uses 2 puffs at night. She is currently using only at night.  Cough is dry, every now and then she will get a small amount of phlegm.  She does have PND.  This is a constant issues. She can't use medicated nasal sprays due to nose bleeds. She does not feel she has more PND than usual.  She does not feel a difference in taste or smell.  She does associate fatigue. Notes that her apple watch gave her points for exercise for walking around the grocery store the other day which is not her baseline.   11/17/21:  - seen in ED-acute non recurrent sinusitis with cough: Plan: symbicort,  start amoxicillin, PRN promethazine DM for cough.  11/07/21: viral URI with cough: wheezing and congestion while traveling.  Using albuterol and Symbicort.  Developed fatigue, myalgias and increased/coughing, wheezing. Plan: guaifenesin, pseudoephedrine, delsym, albuterol and Symbicort 160 PRN. Prescribed prednisone 20 mg x 4 days, then 10 mg x 3 days.  Allergies as of 11/24/2021       Reactions   Oxycodone Other (See Comments)   Severe headache Severe headache Severe headache   Tapentadol Rash        Medication List        Accurate as of November 24, 2021 12:54 PM. If you have any questions, ask your nurse or doctor.          STOP taking these medications    predniSONE 20 MG tablet Commonly known as: DELTASONE Stopped by: Verlee Monte, MD       TAKE these medications    albuterol 108 (90 Base) MCG/ACT inhaler Commonly known as: VENTOLIN HFA Inhale 2 puffs into the lungs every 6 (six) hours as needed for wheezing or shortness of breath.   amoxicillin 875 MG tablet Commonly known as: AMOXIL Take 1 tablet (875 mg total) by mouth 2 (two) times daily.   Carbinoxamine Maleate 4 MG Tabs Take one tablet every 8 hours for runny nose   EPINEPHrine 0.3 mg/0.3 mL Soaj injection Commonly known as: Auvi-Q Inject 0.3 mg into the muscle as needed for anaphylaxis.  Fish Oil 1000 MG Caps Take by mouth.   ibuprofen 800 MG tablet Commonly known as: ADVIL Take 800 mg by mouth 3 (three) times daily.   levocetirizine 5 MG tablet Commonly known as: XYZAL Take 1 tablet (5 mg total) by mouth daily.   montelukast 10 MG tablet Commonly known as: SINGULAIR Take 1 tablet (10 mg total) by mouth at bedtime.   promethazine-dextromethorphan 6.25-15 MG/5ML syrup Commonly known as: PROMETHAZINE-DM Take 5 mLs by mouth 3 (three) times daily as needed for cough.   Symbicort 160-4.5 MCG/ACT inhaler Generic drug: budesonide-formoterol Inhale 2 puffs into the lungs 2 (two) times daily.    triamcinolone ointment 0.1 % Commonly known as: KENALOG Apply 1 application topically 2 (two) times daily as needed.       History reviewed. No pertinent past medical history. Past Surgical History:  Procedure Laterality Date   GALLBLADDER SURGERY     KNEE SURGERY Bilateral    SHOULDER ARTHROSCOPY     Otherwise, there have been no changes to her past medical history, surgical history, family history, or social history.  ROS: All others negative except as noted per HPI.   Objective:  BP 122/70 (BP Location: Right Arm, Patient Position: Sitting, Cuff Size: Large)   Pulse 82   Temp (!) 97.3 F (36.3 C) (Temporal)   Resp 16   Ht 5' 10.5" (1.791 m)   Wt 249 lb 3.2 oz (113 kg)   SpO2 98%   BMI 35.25 kg/m  Body mass index is 35.25 kg/m. Physical Exam: General Appearance:  Alert, cooperative, no distress, appears stated age  Head:  Normocephalic, without obvious abnormality, atraumatic  Eyes:  Conjunctiva clear, EOM's intact  Nose: Nares normal, hypertrophic turbinates, normal mucosa, and no visible anterior polyps  Throat: Lips, tongue normal; teeth and gums normal, normal posterior oropharynx and + cobblestoning  Neck: Supple, symmetrical  Lungs:   clear to auscultation bilaterally, Respirations unlabored, intermittent dry coughing  Heart:  regular rate and rhythm and no murmur, Appears well perfused  Extremities: No edema  Skin: Skin color, texture, turgor normal, no rashes or lesions on visualized portions of skin  Neurologic: No gross deficits  Spirometry:  Tracings reviewed. Her effort: Good reproducible efforts. FVC: 3.12L FEV1: 2.60L, 74% predicted FEV1/FVC ratio: 101% Interpretation: Spirometry consistent with possible restrictive disease.  Please see scanned spirometry results for details.   Assessment/Plan  Subacute cough x 4 weeks, exam concerning for asthma, no baseline spirometry available for review.  Also discussed untreated sinusitis/bronchitis as  possibility vs upper airway cough.  Given her associated fatigue, discussed course of different antibiotics pending CXR.  Subacute cough: suspect infectious etiology with asthma flare - your breathing test did not show obstruction, but your exhale was longer than normal which is typically due to lower lung inflammation/asthma - increase Symbicort to 2 puffs twice a day using a spacer, and use for at least 2 weeks or until symptoms resolve - albuterol 2 puffs every 4 to 6 hours as needed for shortness of breath, cough, chest tightness; okay to use prior to Symbicort - Chest Xray today; will call with results to discuss antibiotics - steroid pack-can start if no improvement in symptoms after 2-3 days of new plan - Continue your singulair 10 mg nightly and Xyzal 5 mg daily - can use nasal saline spray prior to medicated nasal sprays (atrovent - can use up to two sprays three times daily as needed for drainage, nasacort-can use 2 sprays daily for congestion/drainage)-discontinue medicated  sprays if having nose bleeds   It was so nice meeting you today! Please return for your regular yearly visit at your convenience. Sigurd Sos, MD [facility]

## 2021-11-24 NOTE — Progress Notes (Signed)
Please let Caroline Gill know that her CXR looks okay and her read is normal.    I would like to send in azithromcyin 250 mg for her - 2 pills on day 1 followed by 1 pill on days 2-5.  She can stop her current antibiotic since it is not improving symptoms.

## 2021-11-24 NOTE — Progress Notes (Signed)
Azithromycin Rx sent

## 2021-11-24 NOTE — Patient Instructions (Addendum)
Subacute cough: suspect infectious etiology - your breathing test did not show obstruction, but your exhale was longer than normal which is typically due to lower lung inflammation/asthma - increase Symbicort to 2 puffs twice a day using a spacer, and use for at least 2 weeks or until symptoms resolve - albuterol 2 puffs every 4 to 6 hours as needed for shortness of breath, cough, chest tightness; okay to use prior to Symbicort - Chest Xray today; will call with results to discuss antibiotics - steroid pack-can start if no improvement in symptoms after 2-3 days of new plan - Continue your singulair 10 mg nightly and Xyzal 5 mg daily - can use nasal saline spray prior to medicated nasal sprays (atrovent - can use up to two sprays three times daily as needed for drainage, nasacort-can use 2 sprays daily for congestion/drainage)-discontinue medicated sprays if having nose bleeds   It was so nice meeting you today! Please return for your regular yearly visit at your convenience. Sigurd Sos, MD [facility]

## 2022-01-07 ENCOUNTER — Ambulatory Visit: Payer: 59 | Admitting: Family Medicine

## 2022-01-07 ENCOUNTER — Encounter: Payer: Self-pay | Admitting: Family Medicine

## 2022-01-07 VITALS — BP 108/72 | HR 73 | Temp 98.0°F | Ht 70.25 in | Wt 256.0 lb

## 2022-01-07 DIAGNOSIS — R252 Cramp and spasm: Secondary | ICD-10-CM | POA: Diagnosis not present

## 2022-01-07 DIAGNOSIS — D72829 Elevated white blood cell count, unspecified: Secondary | ICD-10-CM | POA: Diagnosis not present

## 2022-01-07 DIAGNOSIS — Z79899 Other long term (current) drug therapy: Secondary | ICD-10-CM | POA: Insufficient documentation

## 2022-01-07 DIAGNOSIS — Z23 Encounter for immunization: Secondary | ICD-10-CM

## 2022-01-07 DIAGNOSIS — Z7689 Persons encountering health services in other specified circumstances: Secondary | ICD-10-CM | POA: Diagnosis not present

## 2022-01-07 HISTORY — DX: Elevated white blood cell count, unspecified: D72.829

## 2022-01-07 LAB — COMPREHENSIVE METABOLIC PANEL
ALT: 14 U/L (ref 0–35)
AST: 19 U/L (ref 0–37)
Albumin: 4.2 g/dL (ref 3.5–5.2)
Alkaline Phosphatase: 101 U/L (ref 39–117)
BUN: 19 mg/dL (ref 6–23)
CO2: 27 mEq/L (ref 19–32)
Calcium: 8.9 mg/dL (ref 8.4–10.5)
Chloride: 105 mEq/L (ref 96–112)
Creatinine, Ser: 0.58 mg/dL (ref 0.40–1.20)
GFR: 107.01 mL/min (ref >60.00)
Glucose, Bld: 109 mg/dL — ABNORMAL HIGH (ref 70–99)
Potassium: 4 mEq/L (ref 3.5–5.1)
Sodium: 139 mEq/L (ref 135–145)
Total Bilirubin: 0.4 mg/dL (ref 0.2–1.2)
Total Protein: 6.8 g/dL (ref 6.0–8.3)

## 2022-01-07 LAB — TSH: TSH: 1.13 u[IU]/mL (ref 0.35–5.50)

## 2022-01-07 LAB — MAGNESIUM: Magnesium: 1.9 mg/dL (ref 1.5–2.5)

## 2022-01-07 LAB — VITAMIN D 25 HYDROXY (VIT D DEFICIENCY, FRACTURES): VITD: 20.03 ng/mL — ABNORMAL LOW (ref 30.00–100.00)

## 2022-01-07 NOTE — Progress Notes (Unsigned)
Patient ID: Caroline Gill, female  DOB: June 15, 1976, 46 y.o.   MRN: [id number] Patient Care Team    Relationship Specialty Notifications Start End  Ma Hillock, DO PCP - General Family Medicine  01/07/22   Boyd Kerbs, MD Consulting Physician Neurology  01/07/22   Rayne Du. Christel Mormon, [place] Physician Cardiology  01/07/22   Clemon Chambers, MD Consulting Physician Allergy and Immunology  01/07/22   Ala Dach, MD Referring Physician Hematology and Oncology  01/07/22   Ezzie Dural T  Gastroenterology  01/07/22    Comment: Ronnald Ramp, MD Referring Physician Pulmonary Disease  01/07/22     Chief Complaint  Patient presents with   Establish Care   Spasms    Pt c/o cramping in feet and legs, worsen when was on Lasix, x 1 year;    Subjective: Caroline Gill is a 46 y.o.  female present for new patient establishment -acute concern All past medical history, surgical history, allergies, family history, immunizations, medications and social history were updated in the electronic medical record today. All recent labs, ED visits and hospitalizations within the last year were reviewed.  Leg cramps: Patient reports she has had nightly leg cramps in her calves and feet.  She feels she hydrates well daily. Has been on a diuretic and flecainide. Flecainide recently dc'd by cardio d/t pt feeling it may have been making her depression worsen.  Diuretic was discontinued, but she feels the leg cramps have been constant despite discontinuation of diuretic. She is being treated for asthma through her allergist, with Symbicort and albuterol.  She does not take a multivitamin.     01/07/2022   10:02 AM  Depression screen PHQ 2/9  Decreased Interest 3  Down, Depressed, Hopeless 1  PHQ - 2 Score 4       No data to display                 No data to display           Immunization History  Administered Date(s) Administered   PFIZER(Purple Top)SARS-COV-2  Vaccination 12/06/2019, 01/12/2020   Tdap 01/07/2022    No results found.  Past Medical History:  Diagnosis Date   Arthritis    Asthma    Atrial fibrillation ([place])    Chicken pox    Depression    Migraine    SVT (supraventricular tachycardia) (HCC)    Allergies  Allergen Reactions   Oxycodone Other (See Comments)    Severe headache Severe headache Severe headache   Tapentadol Rash   Past Surgical History:  Procedure Laterality Date   BREAST BIOPSY  2020   CHOLECYSTECTOMY  2013   ENDOMETRIAL ABLATION  2013   KNEE ARTHROSCOPY Right 2018   meniscus   KNEE SURGERY Left 1992   "dislox"   SHOULDER ARTHROSCOPY  2018   Labral tear with anchors x3   VAGINAL DELIVERY     X3, 1996, 1999, 2002   Family History  Problem Relation Age of Onset   Skin cancer Mother    Asthma Mother    Osteoarthritis Mother    Thyroid disease Mother    Diabetes Mother    Stroke Mother    Atrial fibrillation Mother    Alcohol abuse Mother    Depression Mother    Hypertension Father    Osteoarthritis Father    Diabetes Father    Atrial fibrillation Father    Stroke Father    Hypertension Brother  Osteoarthritis Brother    Alcohol abuse Brother    Asthma Brother    Drug abuse Brother    Skin cancer Brother    Hypertension Maternal Grandmother    Diabetes Maternal Grandmother    Depression Maternal Grandmother    Osteoarthritis Maternal Grandmother    Miscarriages / Stillbirths Maternal Grandmother    Bone cancer Maternal Grandfather        late 84s   Dementia Maternal Grandfather    Depression Paternal Grandmother    Migraines Paternal Grandmother    Heart attack Paternal Grandfather 52   Angioedema Neg Hx    Allergic rhinitis Neg Hx    Immunodeficiency Neg Hx    Urticaria Neg Hx    Eczema Neg Hx    Social History   Social History Narrative   Marital status/children/pets: Married.  3 children.   Education/employment: College-educated.  Owner/CFO-employed   Safety:       -Wears a bicycle helmet riding a bike: Yes     -smoke alarm in the home:Yes     - wears seatbelt: Yes     - Feels safe in their relationships: Yes       Allergies as of 01/07/2022       Reactions   Oxycodone Other (See Comments)   Severe headache Severe headache Severe headache   Tapentadol Rash        Medication List        Accurate as of January 07, 2022 11:59 PM. If you have any questions, ask your nurse or doctor.          STOP taking these medications    amoxicillin 875 MG tablet Commonly known as: AMOXIL Stopped by: Howard Pouch, DO   azithromycin 250 MG tablet Commonly known as: Zithromax Z-Pak Stopped by: Howard Pouch, DO   Carbinoxamine Maleate 4 MG Tabs Stopped by: Howard Pouch, DO   ibuprofen 800 MG tablet Commonly known as: ADVIL Stopped by: Howard Pouch, DO   promethazine-dextromethorphan 6.25-15 MG/5ML syrup Commonly known as: PROMETHAZINE-DM Stopped by: Howard Pouch, DO       TAKE these medications    albuterol 108 (90 Base) MCG/ACT inhaler Commonly known as: VENTOLIN HFA Inhale 2 puffs into the lungs every 6 (six) hours as needed for wheezing or shortness of breath.   EPINEPHrine 0.3 mg/0.3 mL Soaj injection Commonly known as: Auvi-Q Inject 0.3 mg into the muscle as needed for anaphylaxis.   Fish Oil 1000 MG Caps Take by mouth.   levocetirizine 5 MG tablet Commonly known as: XYZAL Take 1 tablet (5 mg total) by mouth daily.   montelukast 10 MG tablet Commonly known as: SINGULAIR Take 1 tablet (10 mg total) by mouth at bedtime.   Symbicort 160-4.5 MCG/ACT inhaler Generic drug: budesonide-formoterol Inhale 2 puffs into the lungs 2 (two) times daily.   triamcinolone ointment 0.1 % Commonly known as: KENALOG Apply 1 application topically 2 (two) times daily as needed.        All past medical history, surgical history, allergies, family history, immunizations andmedications were updated in the EMR today and reviewed under the  history and medication portions of their EMR.    Recent Results (from the past 2160 hour(s))  Comp Met (CMET)     Status: Abnormal   Collection Time: 01/07/22 10:47 AM  Result Value Ref Range   Sodium 139 135 - 145 mEq/L   Potassium 4.0 3.5 - 5.1 mEq/L   Chloride 105 96 - 112 mEq/L   CO2 27  19 - 32 mEq/L   Glucose, Bld 109 (H) 70 - 99 mg/dL   BUN 19 6 - 23 mg/dL   Creatinine, Ser 0.58 0.40 - 1.20 mg/dL   Total Bilirubin 0.4 0.2 - 1.2 mg/dL   Alkaline Phosphatase 101 39 - 117 U/L   AST 19 0 - 37 U/L   ALT 14 0 - 35 U/L   Total Protein 6.8 6.0 - 8.3 g/dL   Albumin 4.2 3.5 - 5.2 g/dL   GFR 107.01 >60.00 mL/min    Comment: Calculated using the CKD-EPI Creatinine Equation (2021)   Calcium 8.9 8.4 - 10.5 mg/dL  TSH     Status: None   Collection Time: 01/07/22 10:47 AM  Result Value Ref Range   TSH 1.13 0.35 - 5.50 uIU/mL  Magnesium     Status: None   Collection Time: 01/07/22 10:47 AM  Result Value Ref Range   Magnesium 1.9 1.5 - 2.5 mg/dL  Iron, TIBC and Ferritin Panel     Status: Abnormal   Collection Time: 01/07/22 10:47 AM  Result Value Ref Range   Iron 46 40 - 190 mcg/dL   TIBC 341 250 - 450 mcg/dL (calc)   %SAT 13 (L) 16 - 45 % (calc)   Ferritin 106 16 - 232 ng/mL  Vitamin D (25 hydroxy)     Status: Abnormal   Collection Time: 01/07/22 10:47 AM  Result Value Ref Range   VITD 20.03 (L) 30.00 - 100.00 ng/mL     ROS 14 pt review of systems performed and negative (unless mentioned in an HPI)  Objective: BP 108/72   Pulse 73   Temp 98 F (36.7 C) (Oral)   Ht 5' 10.25" (1.784 m)   Wt 256 lb (116.1 kg)   SpO2 98%   BMI 36.47 kg/m  Physical Exam Vitals and nursing note reviewed.  Constitutional:      General: She is not in acute distress.    Appearance: Normal appearance. She is normal weight. She is not ill-appearing or toxic-appearing.  HENT:     Head: Normocephalic and atraumatic.  Eyes:     Extraocular Movements: Extraocular movements intact.      Conjunctiva/sclera: Conjunctivae normal.     Pupils: Pupils are equal, round, and reactive to light.  Cardiovascular:     Rate and Rhythm: Normal rate and regular rhythm.  Pulmonary:     Effort: Pulmonary effort is normal.     Breath sounds: Normal breath sounds.  Musculoskeletal:     Cervical back: Neck supple.     Right lower leg: No edema.     Left lower leg: No edema.  Skin:    Capillary Refill: Capillary refill takes less than 2 seconds.     Findings: No rash.  Neurological:     Mental Status: She is alert and oriented to person, place, and time. Mental status is at baseline.  Psychiatric:        Mood and Affect: Mood normal.        Behavior: Behavior normal.        Thought Content: Thought content normal.        Judgment: Judgment normal.         Assessment/plan: Senta Kantor is a 46 y.o. female present for est care Establishing care with new doctor, encounter for Leukocytosis, unspecified type Following with [facility] hematology, appears reactive.  Leg cramps/ Long-term use of high-risk medication We will rule out endocrine disorder, electrolyte disturbance, check magnesium, iron and vitamin  D as potential causes. Patient was encouraged to stretch 1-2 times daily. Consider heat application We briefly discussed medications such as baclofen or gabapentin can be used.  She would is hoping to not have to use prescribed medication. - Comp Met (CMET) - TSH - Magnesium - Iron, TIBC and Ferritin Panel - Vitamin D (25 hydroxy)  Need for diphtheria-tetanus-pertussis (Tdap) vaccine - Tdap vaccine greater than or equal to 7yo IM   Return if symptoms worsen or fail to improve.  Orders Placed This Encounter  Procedures   Tdap vaccine greater than or equal to 7yo IM   Comp Met (CMET)   TSH   Magnesium   Iron, TIBC and Ferritin Panel   Vitamin D (25 hydroxy)   No orders of the defined types were placed in this encounter.  Referral Orders  No referral(s) requested  today     Note is dictated utilizing voice recognition software. Although note has been proof read prior to signing, occasional typographical errors still can be missed. If any questions arise, please do not hesitate to call for verification.  Electronically signed by: Howard Pouch, DO [place]

## 2022-01-07 NOTE — Patient Instructions (Addendum)
Return if symptoms worsen or fail to improve.        Great to meet you today.  I have refilled the medication(s) we provide.   Leg Cramps Leg cramps occur when one or more muscles tighten and a person has no control over it (involuntary muscle contraction). Muscle cramps are most common in the calf muscles of the leg. They can occur during exercise or at rest. Leg cramps are painful, and they may last for a few seconds to a few minutes. Cramps may return several times before they finally stop. Usually, leg cramps are not caused by a serious medical problem. In many cases, the cause is not known. Some common causes include: Excessive physical effort (overexertion), such as during intense exercise. Doing the same motion over and over. Staying in a certain position for a long period of time. Improper preparation, form, or technique while doing a sport or an activity. Dehydration. Injury. Side effects of certain medicines. Abnormally low levels of minerals in your blood (electrolytes), especially potassium and calcium. This could result from: Pregnancy. Taking diuretic medicines. Follow these instructions at home: Eating and drinking Drink enough fluid to keep your urine pale yellow. Staying hydrated may help prevent cramps. Eat a healthy diet that includes plenty of nutrients to help your muscles function. A healthy diet includes fruits and vegetables, lean protein, whole grains, and low-fat or nonfat dairy products. Managing pain, stiffness, and swelling     Try massaging, stretching, and relaxing the affected muscle. Do this for several minutes at a time. If directed, put ice on areas that are sore or painful after a cramp. To do this: Put ice in a plastic bag. Place a towel between your skin and the bag. Leave the ice on for 20 minutes, 2-3 times a day. Remove the ice if your skin turns bright red. This is very important. If you cannot feel pain, heat, or cold, you have a greater  risk of damage to the area. If directed, apply heat to muscles that are tense or tight. Do this before you exercise, or as often as told by your health care provider. Use the heat source that your health care provider recommends, such as a moist heat pack or a heating pad. To do this: Place a towel between your skin and the heat source. Leave the heat on for 20-30 minutes. Remove the heat if your skin turns bright red. This is especially important if you are unable to feel pain, heat, or cold. You may have a greater risk of getting burned. Try taking hot showers or baths to help relax tight muscles. General instructions If you are having frequent leg cramps, avoid intense exercise for several days. Take over-the-counter and prescription medicines only as told by your health care provider. Keep all follow-up visits. This is important. Contact a health care provider if: Your leg cramps get more severe or more frequent, or they do not improve over time. Your foot becomes cold, numb, or blue. Summary Muscle cramps can develop in any muscle, but the most common place is in the calf muscles of the leg. Leg cramps are painful, and they may last for a few seconds to a few minutes. Usually, leg cramps are not caused by a serious medical problem. Often, the cause is not known. Stay hydrated, and take over-the-counter and prescription medicines only as told by your health care provider. This information is not intended to replace advice given to you by your health care provider.  Make sure you discuss any questions you have with your health care provider. Document Revised: 10/18/2019 Document Reviewed: 10/18/2019 Elsevier Patient Education  2023 [employer].

## 2022-01-08 ENCOUNTER — Telehealth: Payer: Self-pay | Admitting: Family Medicine

## 2022-01-08 LAB — IRON,TIBC AND FERRITIN PANEL
%SAT: 13 % (calc) — ABNORMAL LOW (ref 16–45)
Ferritin: 106 ng/mL (ref 16–232)
Iron: 46 ug/dL (ref 40–190)
TIBC: 341 mcg/dL (calc) (ref 250–450)

## 2022-01-08 MED ORDER — VITAMIN D (ERGOCALCIFEROL) 1.25 MG (50000 UNIT) PO CAPS: 50000.0000 [IU] | ORAL_CAPSULE | ORAL | 0 refills | Status: DC

## 2022-01-08 NOTE — Telephone Encounter (Signed)
Spoke with pt regarding labs and instructions.   

## 2022-01-08 NOTE — Telephone Encounter (Signed)
Please inform patient the following information: Thyroid is functioning normal. Electrolytes are normal. -Iron levels are on the lower end of normal.  She could benefit from incorporating more iron rich foods in her diet or starting a iron supplement such as iron polysaccharide 150 mg just 2-3 times a week.  This particular iron is best tolerated.  Can be found OTC or on [employer].  -Her vitamin D levels are significantly low at 20 and her calcium is at the lowest end of normal.  I have called in a high-dose once weekly vitamin D supplement for her to take for 12 weeks to boost her levels into normal range.   -I do encourage her to add a calcium supplement daily.  Total daily calcium intake for her should be about 1200 mg.  That total is both which she would consume/food and supplement.  I would encourage her to look into her normal calcium intake with dairy, cheese, yogurt etc. and see how much she normally takes in daily, and then if not 1200 mg make up the rest of the calcium supplement. Once we get her vitamin D into normal range she will start absorbing the calcium in her diet more efficiently.  Once 12 weeks is completed of the high-dose, I would encourage her to start vitamin D 1000 units daily, to maintain those levels.  Lastly, her magnesium levels are on the lower end of normal as well.  She would benefit from adding a magnesium supplement before bed of 250-400 mg.  Low calcium levels and lower and magnesium levels certainly will cause leg cramps.  Her iron levels may also be contributing, but usually lower end iron will have symptoms of more restless legs and leg cramps.  Please have her schedule her physical in about 10-12 weeks.  We will be checking her vitamin D levels at that time as well to make sure we were able to get her into normal range.

## 2022-01-20 ENCOUNTER — Encounter: Payer: Self-pay | Admitting: Family Medicine

## 2022-01-21 ENCOUNTER — Encounter: Payer: Self-pay | Admitting: Family Medicine

## 2022-01-21 ENCOUNTER — Ambulatory Visit: Payer: 59 | Admitting: Family Medicine

## 2022-01-21 VITALS — BP 101/69 | HR 75 | Temp 98.2°F | Ht 70.5 in | Wt 254.0 lb

## 2022-01-21 DIAGNOSIS — M791 Myalgia, unspecified site: Secondary | ICD-10-CM | POA: Diagnosis not present

## 2022-01-21 DIAGNOSIS — R252 Cramp and spasm: Secondary | ICD-10-CM | POA: Diagnosis not present

## 2022-01-21 DIAGNOSIS — M2550 Pain in unspecified joint: Secondary | ICD-10-CM

## 2022-01-21 DIAGNOSIS — D72829 Elevated white blood cell count, unspecified: Secondary | ICD-10-CM

## 2022-01-21 MED ORDER — MELOXICAM 15 MG PO TABS: 15.0000 mg | ORAL_TABLET | Freq: Every day | ORAL | 1 refills | Status: DC

## 2022-01-21 NOTE — Patient Instructions (Signed)
Osteoarthritis  Osteoarthritis is a type of arthritis. It refers to joint pain or joint disease. Osteoarthritis affects tissue that covers the ends of bones in joints (cartilage). Cartilage acts as a cushion between the bones and helps them move smoothly. Osteoarthritis occurs when cartilage in the joints gets worn down. Osteoarthritis is sometimes called "wear and tear" arthritis. Osteoarthritis is the most common form of arthritis. It often occurs in older people. It is a condition that gets worse over time. The joints most often affected by this condition are in the fingers, toes, hips, knees, and spine, including the neck and lower back. What are the causes? This condition is caused by the wearing down of cartilage that covers the ends of bones. What increases the risk? The following factors may make you more likely to develop this condition: Being age 50 or older. Obesity. Overuse of joints. Past injury of a joint. Past surgery on a joint. Family history of osteoarthritis. What are the signs or symptoms? The main symptoms of this condition are pain, swelling, and stiffness in the joint. Other symptoms may include: An enlarged joint. More pain and further damage caused by small pieces of bone or cartilage that break off and float inside of the joint. Small deposits of bone (osteophytes) that grow on the edges of the joint. A grating or scraping feeling inside the joint when you move it. Popping or creaking sounds when you move. Difficulty walking or exercising. An inability to grip items, twist your hand(s), or control the movements of your hands and fingers. How is this diagnosed? This condition may be diagnosed based on: Your medical history. A physical exam. Your symptoms. X-rays of the affected joint(s). Blood tests to rule out other types of arthritis. How is this treated? There is no cure for this condition, but treatment can help control pain and improve joint function.  Treatment may include a combination of therapies, such as: Pain relief techniques, such as: Applying heat and cold to the joint. Massage. A form of talk therapy called cognitive behavioral therapy (CBT). This therapy helps you set goals and follow up on the changes that you make. Medicines for pain and inflammation. The medicines can be taken by mouth or applied to the skin. They include: NSAIDs, such as ibuprofen. Prescription medicines. Strong anti-inflammatory medicines (corticosteroids). Certain nutritional supplements. A prescribed exercise program. You may work with a physical therapist. Assistive devices, such as a brace, wrap, splint, specialized glove, or cane. A weight control plan. Surgery, such as: An osteotomy. This is done to reposition the bones and relieve pain or to remove loose pieces of bone and cartilage. Joint replacement surgery. You may need this surgery if you have advanced osteoarthritis. Follow these instructions at home: Activity Rest your affected joints as told by your health care provider. Exercise as told by your health care provider. He or she may recommend specific types of exercise, such as: Strengthening exercises. These are done to strengthen the muscles that support joints affected by arthritis. Aerobic activities. These are exercises, such as brisk walking or water aerobics, that increase your heart rate. Range-of-motion activities. These help your joints move more easily. Balance and agility exercises. Managing pain, stiffness, and swelling     If directed, apply heat to the affected area as often as told by your health care provider. Use the heat source that your health care provider recommends, such as a moist heat pack or a heating pad. If you have a removable assistive device, remove it   as told by your health care provider. Place a towel between your skin and the heat source. If your health care provider tells you to keep the assistive device  on while you apply heat, place a towel between the assistive device and the heat source. Leave the heat on for 20-30 minutes. Remove the heat if your skin turns bright red. This is especially important if you are unable to feel pain, heat, or cold. You may have a greater risk of getting burned. If directed, put ice on the affected area. To do this: If you have a removable assistive device, remove it as told by your health care provider. Put ice in a plastic bag. Place a towel between your skin and the bag. If your health care provider tells you to keep the assistive device on during icing, place a towel between the assistive device and the bag. Leave the ice on for 20 minutes, 2-3 times a day. Move your fingers or toes often to reduce stiffness and swelling. Raise (elevate) the injured area above the level of your heart while you are sitting or lying down. General instructions Take over-the-counter and prescription medicines only as told by your health care provider. Maintain a healthy weight. Follow instructions from your health care provider for weight control. Do not use any products that contain nicotine or tobacco, such as cigarettes, e-cigarettes, and chewing tobacco. If you need help quitting, ask your health care provider. Use assistive devices as told by your health care provider. Keep all follow-up visits as told by your health care provider. This is important. Where to find more information National Institute of Arthritis and Musculoskeletal and Skin Diseases: www.niams.nih.gov National Institute on Aging: www.nia.nih.gov American College of Rheumatology: www.rheumatology.org Contact a health care provider if: You have redness, swelling, or a feeling of warmth in a joint that gets worse. You have a fever along with joint or muscle aches. You develop a rash. You have trouble doing your normal activities. Get help right away if: You have pain that gets worse and is not relieved by  pain medicine. Summary Osteoarthritis is a type of arthritis that affects tissue covering the ends of bones in joints (cartilage). This condition is caused by the wearing down of cartilage that covers the ends of bones. The main symptom of this condition is pain, swelling, and stiffness in the joint. There is no cure for this condition, but treatment can help control pain and improve joint function. This information is not intended to replace advice given to you by your health care provider. Make sure you discuss any questions you have with your health care provider. Document Revised: 05/29/2019 Document Reviewed: 05/29/2019 Elsevier Patient Education  2023 [employer]  

## 2022-01-21 NOTE — Progress Notes (Signed)
Caroline Gill , [birth year], 46 y.o., female MRN: [id number] Patient Care Team    Relationship Specialty Notifications Start End  Natalia Leatherwood, DO PCP - General Family Medicine  01/07/22   Aletha Halim, MD Consulting Physician Neurology  01/07/22   Ronda Fairly. Nicanor Bake, MD Consulting Physician Cardiology  01/07/22   Verlee Monte, MD Consulting Physician Allergy and Immunology  01/07/22   Edwyna Shell, MD Referring Physician Hematology and Oncology  01/07/22   Rolland Bimler T  Gastroenterology  01/07/22    Comment: Concepcion Living, MD Referring Physician Pulmonary Disease  01/07/22     Chief Complaint  Patient presents with   Joint Pain    Pt c/o multiple joint pain x 1 week;      Subjective: Pt presents for an OV with complaints of polyarthralgia of 1 week.  Caroline Gill has started her magnesium supplement at 120 mg nightly.  Caroline Gill started her vitamin D supplement and iron polysaccharide 3 days a week for hypomagnesia, vitamin D deficiency and low iron saturations and borderline low iron.  Initial complaints were fatigue and muscle aches.  CMP was unremarkable.  Vitamin D levels were 20.  Thyroid levels are normal. Caroline Gill reports Caroline Gill has not appreciated much swelling of the joints that are causing her discomfort.  There is been no erythema of the joints.  Joints mostly affected her hips, wrists, elbows and even in her sternum.      01/07/2022   10:02 AM  Depression screen PHQ 2/9  Decreased Interest 3  Down, Depressed, Hopeless 1  PHQ - 2 Score 4    Allergies  Allergen Reactions   Oxycodone Other (See Comments)    Severe headache Severe headache Severe headache   Tapentadol Rash   Social History   Social History Narrative   Marital status/children/pets: Married.  3 children.   Education/employment: College-educated.  Owner/CFO-employed   Safety:      -Wears a bicycle helmet riding a bike: Yes     -smoke alarm in the home:Yes     - wears seatbelt: Yes     - Feels safe in  their relationships: Yes      Past Medical History:  Diagnosis Date   Arthritis    Asthma    Atrial fibrillation (HCC)    Chicken pox    Depression    Migraine    SVT (supraventricular tachycardia) (HCC)    Past Surgical History:  Procedure Laterality Date   BREAST BIOPSY  2020   CHOLECYSTECTOMY  2013   ENDOMETRIAL ABLATION  2013   KNEE ARTHROSCOPY Right 2018   meniscus   KNEE SURGERY Left 1992   "dislox"   SHOULDER ARTHROSCOPY  2018   Labral tear with anchors x3   VAGINAL DELIVERY     X3, 1996, 1999, 2002   Family History  Problem Relation Age of Onset   Skin cancer Mother    Asthma Mother    Osteoarthritis Mother    Thyroid disease Mother    Diabetes Mother    Stroke Mother    Atrial fibrillation Mother    Alcohol abuse Mother    Depression Mother    Hypertension Father    Osteoarthritis Father    Diabetes Father    Atrial fibrillation Father    Stroke Father    Hypertension Brother    Osteoarthritis Brother    Alcohol abuse Brother    Asthma Brother    Drug abuse Brother  Skin cancer Brother    Hypertension Maternal Grandmother    Diabetes Maternal Grandmother    Depression Maternal Grandmother    Osteoarthritis Maternal Grandmother    Miscarriages / Stillbirths Maternal Grandmother    Bone cancer Maternal Grandfather        late 48s   Dementia Maternal Grandfather    Rheum arthritis Paternal Grandmother    Depression Paternal Grandmother    Migraines Paternal Grandmother    Heart attack Paternal Grandfather 53   Angioedema Neg Hx    Allergic rhinitis Neg Hx    Immunodeficiency Neg Hx    Urticaria Neg Hx    Eczema Neg Hx    Allergies as of 01/21/2022       Reactions   Oxycodone Other (See Comments)   Severe headache Severe headache Severe headache   Tapentadol Rash        Medication List        Accurate as of January 21, 2022 11:59 PM. If you have any questions, ask your nurse or doctor.          albuterol 108 (90 Base) MCG/ACT  inhaler Commonly known as: VENTOLIN HFA Inhale 2 puffs into the lungs every 6 (six) hours as needed for wheezing or shortness of breath.   EPINEPHrine 0.3 mg/0.3 mL Soaj injection Commonly known as: Auvi-Q Inject 0.3 mg into the muscle as needed for anaphylaxis.   Fish Oil 1000 MG Caps Take by mouth.   levocetirizine 5 MG tablet Commonly known as: XYZAL Take 1 tablet (5 mg total) by mouth daily.   meloxicam 15 MG tablet Commonly known as: MOBIC Take 1 tablet (15 mg total) by mouth daily. Started by: Felix Pacini, DO   montelukast 10 MG tablet Commonly known as: SINGULAIR Take 1 tablet (10 mg total) by mouth at bedtime.   Symbicort 160-4.5 MCG/ACT inhaler Generic drug: budesonide-formoterol Inhale 2 puffs into the lungs 2 (two) times daily.   triamcinolone ointment 0.1 % Commonly known as: KENALOG Apply 1 application topically 2 (two) times daily as needed.   Vitamin D (Ergocalciferol) 1.25 MG (50000 UNIT) Caps capsule Commonly known as: DRISDOL Take 1 capsule (50,000 Units total) by mouth every 7 (seven) days.        All past medical history, surgical history, allergies, family history, immunizations andmedications were updated in the EMR today and reviewed under the history and medication portions of their EMR.     ROS Negative, with the exception of above mentioned in HPI   Objective:  BP 101/69   Pulse 75   Temp 98.2 F (36.8 C) (Oral)   Ht 5' 10.5" (1.791 m)   Wt 254 lb (115.2 kg)   SpO2 98%   BMI 35.93 kg/m  Body mass index is 35.93 kg/m. Physical Exam Vitals and nursing note reviewed.  Constitutional:      General: Caroline Gill is not in acute distress.    Appearance: Normal appearance. Caroline Gill is not ill-appearing, toxic-appearing or diaphoretic.  HENT:     Head: Normocephalic and atraumatic.  Eyes:     General: No scleral icterus.       Right eye: No discharge.        Left eye: No discharge.     Extraocular Movements: Extraocular movements intact.      Conjunctiva/sclera: Conjunctivae normal.     Pupils: Pupils are equal, round, and reactive to light.  Cardiovascular:     Rate and Rhythm: Normal rate and regular rhythm.  Pulmonary:  Effort: Pulmonary effort is normal.     Breath sounds: Normal breath sounds.  Musculoskeletal:        General: No swelling or deformity.     Right lower leg: No edema.     Left lower leg: No edema.  Skin:    General: Skin is warm and dry.     Coloration: Skin is not jaundiced or pale.     Findings: No bruising, erythema or rash.  Neurological:     Mental Status: Caroline Gill is alert and oriented to person, place, and time. Mental status is at baseline.     Motor: No weakness.     Gait: Gait normal.  Psychiatric:        Mood and Affect: Mood normal.        Behavior: Behavior normal.        Thought Content: Thought content normal.        Judgment: Judgment normal.     No results found. No results found. No results found for this or any previous visit (from the past 24 hour(s)).  Assessment/Plan: Kadince Boxley is a 46 y.o. female present for OV for  Leukocytosis, unspecified type This has been a chronic occurrence for her and has been stable.  Caroline Gill has been followed by specialty team for condition.  Leg cramps myalgia/polyarthralgia Discussed options with her today. Start Mobic 15 mg daily with food. Start work-up for possible causes including inflammatory arthritides and autoimmune conditions. - C-reactive protein - Rheumatoid factor - Sedimentation rate - PTH, Intact and Calcium - ANA, IFA Comprehensive Panel-([facility]) - Cyclic citrul peptide antibody, IgG ([facility]) - CK - Uric acid Patient will be called once all results received and further plan discussed at that time.  Reviewed expectations re: course of current medical issues. Discussed self-management of symptoms. Outlined signs and symptoms indicating need for more acute intervention. Patient verbalized understanding and all questions  were answered. Patient received an After-Visit Summary.    Orders Placed This Encounter  Procedures   C-reactive protein   Rheumatoid factor   Sedimentation rate   PTH, Intact and Calcium   ANA, IFA Comprehensive Panel-([facility])   Cyclic citrul peptide antibody, IgG ([facility])   CK   CBC w/Diff   Uric acid   Meds ordered this encounter  Medications   meloxicam (MOBIC) 15 MG tablet    Sig: Take 1 tablet (15 mg total) by mouth daily.    Dispense:  90 tablet    Refill:  1   Referral Orders  No referral(s) requested today     Note is dictated utilizing voice recognition software. Although note has been proof read prior to signing, occasional typographical errors still can be missed. If any questions arise, please do not hesitate to call for verification.   electronically signed by:  Felix Pacini, DO  [facility] [place]

## 2022-01-22 LAB — CBC WITH DIFFERENTIAL/PLATELET
Basophils Absolute: 0.1 10*3/uL (ref 0.0–0.1)
Basophils Relative: 0.9 % (ref 0.0–3.0)
Eosinophils Absolute: 0.3 10*3/uL (ref 0.0–0.7)
Eosinophils Relative: 2.6 % (ref 0.0–5.0)
HCT: 39.2 % (ref 36.0–46.0)
Hemoglobin: 12.9 g/dL (ref 12.0–15.0)
Lymphocytes Relative: 25.5 % (ref 12.0–46.0)
Lymphs Abs: 3.1 10*3/uL (ref 0.7–4.0)
MCHC: 32.9 g/dL (ref 30.0–36.0)
MCV: 89.4 fl (ref 78.0–100.0)
Monocytes Absolute: 0.5 10*3/uL (ref 0.1–1.0)
Monocytes Relative: 4.3 % (ref 3.0–12.0)
Neutro Abs: 8.1 10*3/uL — ABNORMAL HIGH (ref 1.4–7.7)
Neutrophils Relative %: 66.7 % (ref 43.0–77.0)
Platelets: 261 10*3/uL (ref 150.0–400.0)
RBC: 4.38 Mil/uL (ref 3.87–5.11)
RDW: 13.3 % (ref 11.5–15.5)
WBC: 12.2 10*3/uL — ABNORMAL HIGH (ref 4.0–10.5)

## 2022-01-23 ENCOUNTER — Telehealth: Payer: Self-pay | Admitting: Family Medicine

## 2022-01-23 DIAGNOSIS — M2550 Pain in unspecified joint: Secondary | ICD-10-CM | POA: Insufficient documentation

## 2022-01-23 MED ORDER — PREDNISONE 20 MG PO TABS: ORAL_TABLET | ORAL | 0 refills | Status: DC

## 2022-01-23 NOTE — Telephone Encounter (Signed)
Prednisone taper called in for her

## 2022-01-23 NOTE — Telephone Encounter (Signed)
Spoke with pt regarding labs and instructions. Pt stated that she having some improvement but will like to continue without steroids at this time.

## 2022-01-23 NOTE — Addendum Note (Signed)
Addended by: Maxie Barb on: 01/23/2022 04:40 PM   Modules accepted: Orders

## 2022-01-23 NOTE — Addendum Note (Signed)
Addended by: Felix Pacini A on: 01/23/2022 03:52 PM   Modules accepted: Orders

## 2022-01-23 NOTE — Telephone Encounter (Signed)
Please call patient: I still have one of her results pending which may take a few more days to complete which is the autoimmune panel.  Normal results so far: Calcium, uric acid, CK,  parathyroid levels. Negative rheumatoid factor and CCP, this makes rheumatoid arthritis less likely as cause. Her inflammatory markers are both elevated.  Sed rate is mildly elevated and CRP is moderately elevated.  Her WBCs are mildly elevated but stable from her prior collections.   Ask her if she is seeing improvement in her symptoms since starting the meloxicam?  If she is not seeing much improvement, may elect to treat with a short course of steroids while we wait on the remaining panel to return.   Please advise of her answer

## 2022-01-23 NOTE — Telephone Encounter (Signed)
Rx discontinued med sent on accident.

## 2022-01-24 LAB — ANA, IFA COMPREHENSIVE PANEL
Anti Nuclear Antibody (ANA): POSITIVE — AB
ENA SM Ab Ser-aCnc: <1 AI
SM/RNP: <1 AI
SSA (Ro) (ENA) Antibody, IgG: <1 AI
SSB (La) (ENA) Antibody, IgG: <1 AI
Scleroderma (Scl-70) (ENA) Antibody, IgG: <1 AI
ds DNA Ab: <1 IU/mL

## 2022-01-24 LAB — ANTI-NUCLEAR AB-TITER (ANA TITER): ANA Titer 1: 1:40 {titer} — ABNORMAL HIGH

## 2022-01-24 LAB — CK: Total CK: 44 U/L (ref 29–143)

## 2022-01-24 LAB — CYCLIC CITRUL PEPTIDE ANTIBODY, IGG: Cyclic Citrullin Peptide Ab: <16 UNITS

## 2022-01-24 LAB — PTH, INTACT AND CALCIUM
Calcium: 9.7 mg/dL (ref 8.6–10.2)
PTH: 31 pg/mL (ref 16–77)

## 2022-01-24 LAB — RHEUMATOID FACTOR: Rheumatoid fact SerPl-aCnc: <14 IU/mL (ref <14)

## 2022-01-24 LAB — URIC ACID: Uric Acid, Serum: 5.4 mg/dL (ref 2.5–7.0)

## 2022-01-24 LAB — SEDIMENTATION RATE: Sed Rate: 25 mm/h — ABNORMAL HIGH (ref 0–20)

## 2022-01-24 LAB — C-REACTIVE PROTEIN: CRP: 26.9 mg/L — ABNORMAL HIGH (ref <8.0)

## 2022-01-26 ENCOUNTER — Telehealth: Payer: Self-pay | Admitting: Family Medicine

## 2022-01-26 DIAGNOSIS — D72829 Elevated white blood cell count, unspecified: Secondary | ICD-10-CM

## 2022-01-26 DIAGNOSIS — M2550 Pain in unspecified joint: Secondary | ICD-10-CM

## 2022-01-26 DIAGNOSIS — R700 Elevated erythrocyte sedimentation rate: Secondary | ICD-10-CM

## 2022-01-26 DIAGNOSIS — R7982 Elevated C-reactive protein (CRP): Secondary | ICD-10-CM

## 2022-01-26 NOTE — Telephone Encounter (Signed)
Please inform patient: CRP inflammatory marker was elevated significantly at 26.9 and her sed rate was mildly elevated. Her ANA was positive ,but the titers were extremely low and the reflex panel was not positive.   -Positive ANA results low titers and negative reflex panel are usually not associated with an autoimmune disorder.  Both inflammatory markers were positive, we could consider referral to rheumatology, and continue the anti-inflammatory while we wait for her to get in for further evaluation. I have placed this referral for her today

## 2022-01-26 NOTE — Telephone Encounter (Signed)
Spoke with pt regarding labs and instructions.   

## 2022-02-11 NOTE — Telephone Encounter (Signed)
Please advise 

## 2022-03-03 ENCOUNTER — Encounter: Payer: Self-pay | Admitting: Family Medicine

## 2022-03-03 NOTE — Telephone Encounter (Signed)
Okay to stop Mobic if she desires. We do not do food sensitivity testing here, they have them at [facility] if she wants referral we can place that for her.

## 2022-03-03 NOTE — Telephone Encounter (Signed)
Please advise 

## 2022-03-17 NOTE — Progress Notes (Signed)
Office Visit Note  Patient: Caroline Gill             Date of Birth: 05-11-1976           MRN: [id number]             PCP: Ma Hillock, DO Referring: Ma Hillock, DO Visit Date: 03/18/2022 Occupation: [employer] of [employer]  Subjective:  New Patient (Initial Visit) (Pain in every joint. Lab results indicated auto-immune markers. )   History of Present Illness: Caroline Gill is a 46 y.o. female here for evaluation of joint and muscle pains with positive ANA and elevated inflammatory markers. These symptoms started pretty acutely over the course of a few days. She did not recall preceding illness or medical events. After visit with PCP office she started on meloxicam and tried eliminating several items from her diet with a good improvement. After adopting a diet with only meat, eggs, and dairy after about a week. She stopped meloxicam as well due to improvement in symptoms. She has had a few cheat meals including a variety of foods and noticed some pain and stiffness again for a few days. She has a history of significant allergies and was on allergy shots in the past. She had some food allergens testing maybe about 5 years ago that was negative. Colonoscopy 1 year ago was fine, has tendency to diarrhea but chronic since gallbladder removal. Overall feeling well without complaints today.  Labs reviewed ANA pos 1:40 homogenous dsDNA, Scl-70, Sm, RNP, SSA, SSB neg RF neg CCP neg ESR 25 CRP 26.9 CK 44 Uric acid 5.4  Activities of Daily Living:  Patient reports morning stiffness for 1 minute.   Patient Reports nocturnal pain.  Difficulty dressing/grooming: Denies Difficulty climbing stairs: Reports Difficulty getting out of chair: Reports Difficulty using hands for taps, buttons, cutlery, and/or writing: Denies  Review of Systems  Constitutional:  Positive for fatigue.  HENT:  Positive for mouth dryness.   Eyes:  Positive for dryness.  Cardiovascular:  Positive for  palpitations. Negative for chest pain.  Gastrointestinal:  Negative for blood in stool and constipation.  Endocrine: Negative for increased urination.  Genitourinary:  Negative for involuntary urination.  Musculoskeletal:  Positive for joint pain, gait problem, joint pain, joint swelling and morning stiffness. Negative for myalgias, muscle weakness, muscle tenderness and myalgias.  Skin:  Negative for color change, rash, hair loss and sensitivity to sunlight.  Allergic/Immunologic: Negative for susceptible to infections.  Neurological:  Positive for headaches. Negative for dizziness.  Hematological:  Negative for swollen glands.  Psychiatric/Behavioral:  Positive for depressed mood and sleep disturbance. The patient is nervous/anxious.     PMFS History:  Patient Active Problem List   Diagnosis Date Noted   Positive ANA (antinuclear antibody) 03/18/2022   CRP elevated 03/18/2022   Polyarthralgia 01/23/2022   Elevated WBC count 01/07/2022   Long-term use of high-risk medication 01/07/2022   Leg cramps 01/07/2022   Allergic reaction 03/04/2020   Extrinsic asthma 03/04/2020   Seasonal and perennial allergic rhinitis 03/04/2020   Allergic contact dermatitis 03/04/2020   Family history of hypercoagulability 03/03/2013    Past Medical History:  Diagnosis Date   Arthritis    Asthma    Atrial fibrillation (HCC)    Chicken pox    Depression    Migraine    SVT (supraventricular tachycardia)     Family History  Problem Relation Age of Onset   Skin cancer Mother    Asthma Mother  Osteoarthritis Mother    Thyroid disease Mother    Diabetes Mother    Stroke Mother    Atrial fibrillation Mother    Alcohol abuse Mother    Depression Mother    Hypertension Father    Osteoarthritis Father    Diabetes Father    Atrial fibrillation Father    Stroke Father    Hypertension Brother    Osteoarthritis Brother    Alcohol abuse Brother    Asthma Brother    Drug abuse Brother    Skin  cancer Brother    Hypertension Maternal Grandmother    Diabetes Maternal Grandmother    Depression Maternal Grandmother    Osteoarthritis Maternal Grandmother    Miscarriages / Stillbirths Maternal Grandmother    Bone cancer Maternal Grandfather        late 22s   Dementia Maternal Grandfather    Rheum arthritis Paternal Grandmother    Depression Paternal Grandmother    Migraines Paternal Grandmother    Heart attack Paternal Grandfather 21   Depression Daughter    Anxiety disorder Daughter    ADD / ADHD Daughter    ADD / ADHD Son    Hypothyroidism Son    ADD / ADHD Son    Angioedema Neg Hx    Allergic rhinitis Neg Hx    Immunodeficiency Neg Hx    Urticaria Neg Hx    Eczema Neg Hx    Past Surgical History:  Procedure Laterality Date   BREAST BIOPSY  2020   CHOLECYSTECTOMY  2013   ENDOMETRIAL ABLATION  2013   KNEE ARTHROSCOPY Right 2018   meniscus   KNEE SURGERY Left 1992   "dislox"   SHOULDER ARTHROSCOPY  2018   Labral tear with anchors x3   VAGINAL DELIVERY     X3, 1996, 1999, 2002   Social History   Social History Narrative   Marital status/children/pets: Married.  3 children.   Education/employment: College-educated.  Owner/CFO-employed   Safety:      -Wears a bicycle helmet riding a bike: Yes     -smoke alarm in the home:Yes     - wears seatbelt: Yes     - Feels safe in their relationships: Yes      Immunization History  Administered Date(s) Administered   PFIZER(Purple Top)SARS-COV-2 Vaccination 12/06/2019, 01/12/2020   Tdap 01/07/2022     Objective: Vital Signs: BP 118/80 (BP Location: Right Arm, Patient Position: Sitting, Cuff Size: Normal)   Pulse 71   Resp 14   Ht 5' 9.5" (1.765 m)   Wt 240 lb 3.2 oz (109 kg)   BMI 34.96 kg/m    Physical Exam Constitutional:      Appearance: She is obese.  HENT:     Mouth/Throat:     Mouth: Mucous membranes are moist.     Pharynx: Oropharynx is clear.  Eyes:     Conjunctiva/sclera: Conjunctivae normal.   Cardiovascular:     Rate and Rhythm: Normal rate and regular rhythm.  Pulmonary:     Effort: Pulmonary effort is normal.     Breath sounds: Normal breath sounds.  Musculoskeletal:     Right lower leg: No edema.     Left lower leg: No edema.  Lymphadenopathy:     Cervical: No cervical adenopathy.  Skin:    General: Skin is warm and dry.     Findings: No rash.     Comments: No digital or nail changes  Neurological:     Mental Status: She is  alert.  Psychiatric:        Mood and Affect: Mood normal.      Musculoskeletal Exam:  Neck full ROM no tenderness Shoulders full ROM no tenderness or swelling Elbows full ROM no tenderness or swelling Wrists full ROM no tenderness or swelling Fingers full ROM no tenderness or swelling Knees full ROM no tenderness or swelling, left knee patellofemoral crepitus present Ankles full ROM no tenderness or swelling   Investigation: No additional findings.  Imaging: No results found.  Recent Labs: Lab Results  Component Value Date   WBC 12.2 (H) 01/21/2022   HGB 12.9 01/21/2022   PLT 261.0 01/21/2022   NA 139 01/07/2022   K 4.0 01/07/2022   CL 105 01/07/2022   CO2 27 01/07/2022   GLUCOSE 109 (H) 01/07/2022   BUN 19 01/07/2022   CREATININE 0.58 01/07/2022   BILITOT 0.4 01/07/2022   ALKPHOS 101 01/07/2022   AST 19 01/07/2022   ALT 14 01/07/2022   PROT 6.8 01/07/2022   ALBUMIN 4.2 01/07/2022   CALCIUM 9.7 01/21/2022   GFRAA 124 03/13/2020    Speciality Comments: No specialty comments available.  Procedures:  No procedures performed Allergies: Oxycodone and Tapentadol   Assessment / Plan:     Visit Diagnoses: Positive ANA (antinuclear antibody)  Positive ANA but at very low titer 1: 40 and with negative extractable nuclear antibody reflex panel already.  She does not demonstrate specific clinical criteria on exam and overall today appears pretty well.  Do not recommend additional diagnostic studies or empiric treatment  based on current findings.  Polyarthralgia - Plan: Sedimentation rate, C-reactive protein, Tissue transglutaminase, IgA  Joint pain in multiple areas with some swelling reported there are no changes on examination today.  Symptoms are also much less at this time compared to the original reported.  Did have some responsiveness to NSAID medications based on her report large variation with dietary changes.  We will check TTG also rechecking the previously elevated inflammatory markers.  Tolerating a almost entirely meat diet no suspicion of alpha gal.  With pretty benign symptoms currently if labs are downtrending would recommend observation for now.  Could also consider formal allergy clinic evaluation of more extensive food sensitivity possibilities.   Orders: Orders Placed This Encounter  Procedures   Sedimentation rate   C-reactive protein   Tissue transglutaminase, IgA   No orders of the defined types were placed in this encounter.    Follow-Up Instructions: Return if symptoms worsen or fail to improve.   Collier Salina, MD  Note - This record has been created using [employer].  Chart creation errors have been sought, but may not always  have been located. Such creation errors do not reflect on  the standard of medical care.

## 2022-03-18 ENCOUNTER — Encounter: Payer: Self-pay | Admitting: Internal Medicine

## 2022-03-18 ENCOUNTER — Ambulatory Visit: Payer: 59 | Attending: Internal Medicine | Admitting: Internal Medicine

## 2022-03-18 VITALS — BP 118/80 | HR 71 | Resp 14 | Ht 69.5 in | Wt 240.2 lb

## 2022-03-18 DIAGNOSIS — R7982 Elevated C-reactive protein (CRP): Secondary | ICD-10-CM | POA: Diagnosis not present

## 2022-03-18 DIAGNOSIS — R768 Other specified abnormal immunological findings in serum: Secondary | ICD-10-CM | POA: Diagnosis not present

## 2022-03-18 DIAGNOSIS — M2550 Pain in unspecified joint: Secondary | ICD-10-CM | POA: Diagnosis not present

## 2022-03-19 LAB — C-REACTIVE PROTEIN: CRP: 20.7 mg/L — ABNORMAL HIGH (ref <8.0)

## 2022-03-19 LAB — SEDIMENTATION RATE: Sed Rate: 22 mm/h — ABNORMAL HIGH (ref 0–20)

## 2022-03-19 LAB — TISSUE TRANSGLUTAMINASE, IGA: (tTG) Ab, IgA: <1 U/mL

## 2022-03-24 ENCOUNTER — Other Ambulatory Visit: Payer: Self-pay | Admitting: Family Medicine

## 2022-03-25 ENCOUNTER — Encounter: Payer: Self-pay | Admitting: Internal Medicine

## 2022-04-06 ENCOUNTER — Ambulatory Visit (INDEPENDENT_AMBULATORY_CARE_PROVIDER_SITE_OTHER): Payer: 59 | Admitting: Family Medicine

## 2022-04-06 ENCOUNTER — Encounter: Payer: Self-pay | Admitting: Family Medicine

## 2022-04-06 VITALS — BP 119/76 | HR 82 | Temp 98.6°F | Ht 70.5 in | Wt 236.4 lb

## 2022-04-06 DIAGNOSIS — Z1159 Encounter for screening for other viral diseases: Secondary | ICD-10-CM | POA: Diagnosis not present

## 2022-04-06 DIAGNOSIS — E669 Obesity, unspecified: Secondary | ICD-10-CM | POA: Diagnosis not present

## 2022-04-06 DIAGNOSIS — R7982 Elevated C-reactive protein (CRP): Secondary | ICD-10-CM

## 2022-04-06 DIAGNOSIS — Z0000 Encounter for general adult medical examination without abnormal findings: Secondary | ICD-10-CM

## 2022-04-06 DIAGNOSIS — E559 Vitamin D deficiency, unspecified: Secondary | ICD-10-CM

## 2022-04-06 DIAGNOSIS — Z79899 Other long term (current) drug therapy: Secondary | ICD-10-CM

## 2022-04-06 DIAGNOSIS — R790 Abnormal level of blood mineral: Secondary | ICD-10-CM

## 2022-04-06 LAB — CBC WITH DIFFERENTIAL/PLATELET
Basophils Absolute: 0.1 10*3/uL (ref 0.0–0.1)
Basophils Relative: 0.8 % (ref 0.0–3.0)
Eosinophils Absolute: 0.2 10*3/uL (ref 0.0–0.7)
Eosinophils Relative: 2 % (ref 0.0–5.0)
HCT: 40.1 % (ref 36.0–46.0)
Hemoglobin: 13.2 g/dL (ref 12.0–15.0)
Lymphocytes Relative: 25.5 % (ref 12.0–46.0)
Lymphs Abs: 2.3 10*3/uL (ref 0.7–4.0)
MCHC: 32.9 g/dL (ref 30.0–36.0)
MCV: 89.2 fl (ref 78.0–100.0)
Monocytes Absolute: 0.4 10*3/uL (ref 0.1–1.0)
Monocytes Relative: 4.7 % (ref 3.0–12.0)
Neutro Abs: 6 10*3/uL (ref 1.4–7.7)
Neutrophils Relative %: 67 % (ref 43.0–77.0)
Platelets: 250 10*3/uL (ref 150.0–400.0)
RBC: 4.5 Mil/uL (ref 3.87–5.11)
RDW: 14.2 % (ref 11.5–15.5)
WBC: 9 10*3/uL (ref 4.0–10.5)

## 2022-04-06 LAB — LIPID PANEL
Cholesterol: 177 mg/dL (ref 0–200)
HDL: 51.5 mg/dL (ref >39.00)
LDL Cholesterol: 116 mg/dL — ABNORMAL HIGH (ref 0–99)
NonHDL: 125.97
Total CHOL/HDL Ratio: 3
Triglycerides: 51 mg/dL (ref 0.0–149.0)
VLDL: 10.2 mg/dL (ref 0.0–40.0)

## 2022-04-06 LAB — HEMOGLOBIN A1C: Hgb A1c MFr Bld: 5.4 % (ref 4.6–6.5)

## 2022-04-06 LAB — VITAMIN D 25 HYDROXY (VIT D DEFICIENCY, FRACTURES): VITD: 44.8 ng/mL (ref 30.00–100.00)

## 2022-04-06 NOTE — Progress Notes (Signed)
Patient ID: Caroline Gill, female  DOB: September 15, 1975, 46 y.o.   MRN: [id number] Patient Care Team    Relationship Specialty Notifications Start End  Natalia Leatherwood, DO PCP - General Family Medicine  01/07/22   Aletha Halim, MD Consulting Physician Neurology  01/07/22   Ronda Fairly. Nicanor Bake, MD Consulting Physician Cardiology  01/07/22   Verlee Monte, MD Consulting Physician Allergy and Immunology  01/07/22   Edwyna Shell, MD Referring Physician Hematology and Oncology  01/07/22   Rolland Bimler T  Gastroenterology  01/07/22    Comment: Concepcion Living, MD Referring Physician Pulmonary Disease  01/07/22     Chief Complaint  Patient presents with   Annual Exam    Pt is fasting    Subjective: Caroline Gill is a 46 y.o.  Female  present for CPE All past medical history, surgical history, allergies, family history, immunizations, medications and social history were updated in the electronic medical record today. All recent labs, ED visits and hospitalizations within the last year were reviewed.  Health maintenance:  Colonoscopy: completed UTD 11/2020 - [facility]- 10 yr Mammogram: completed:UTD 2023- GYN Cervical cancer screening: UTD 2023- gyn Immunizations: tdap UTD 2023, Influenza declined (encouraged yearly) Infectious disease screening: HIV completed pregnancy, Hep C screen completed today DEXA: routine screen       04/06/2022    8:45 AM 01/07/2022   10:02 AM  Depression screen PHQ 2/9  Decreased Interest 0 3  Down, Depressed, Hopeless 0 1  PHQ - 2 Score 0 4  Altered sleeping 1   Tired, decreased energy 1   Change in appetite 0   Feeling bad or failure about yourself  1   Trouble concentrating 1   Moving slowly or fidgety/restless 0   Suicidal thoughts 0   PHQ-9 Score 4       04/06/2022    8:46 AM  GAD 7 : Generalized Anxiety Score  Nervous, Anxious, on Edge 1  Control/stop worrying 0  Worry too much - different things 1  Trouble relaxing 1  Restless 1  Easily  annoyed or irritable 1  Afraid - awful might happen 0  Total GAD 7 Score 5    Immunization History  Administered Date(s) Administered   PFIZER(Purple Top)SARS-COV-2 Vaccination 12/06/2019, 01/12/2020   Tdap 01/07/2022    Past Medical History:  Diagnosis Date   Arthritis    Asthma    Atrial fibrillation (HCC)    Chicken pox    Depression    Migraine    SVT (supraventricular tachycardia)    Allergies  Allergen Reactions   Oxycodone Other (See Comments)    Severe headache Severe headache Severe headache   Tapentadol Rash   Past Surgical History:  Procedure Laterality Date   BREAST BIOPSY  2020   CHOLECYSTECTOMY  2013   ENDOMETRIAL ABLATION  2013   KNEE ARTHROSCOPY Right 2018   meniscus   KNEE SURGERY Left 1992   "dislox"   SHOULDER ARTHROSCOPY  2018   Labral tear with anchors x3   VAGINAL DELIVERY     X3, 1996, 1999, 2002   Family History  Problem Relation Age of Onset   Skin cancer Mother    Asthma Mother    Osteoarthritis Mother    Thyroid disease Mother    Diabetes Mother    Stroke Mother    Atrial fibrillation Mother    Alcohol abuse Mother    Depression Mother    Hypertension Father    Osteoarthritis  Father    Diabetes Father    Atrial fibrillation Father    Stroke Father    Hypertension Brother    Osteoarthritis Brother    Alcohol abuse Brother    Asthma Brother    Drug abuse Brother    Skin cancer Brother    Hypertension Maternal Grandmother    Diabetes Maternal Grandmother    Depression Maternal Grandmother    Osteoarthritis Maternal Grandmother    Miscarriages / Stillbirths Maternal Grandmother    Bone cancer Maternal Grandfather        late 39s   Dementia Maternal Grandfather    Rheum arthritis Paternal Grandmother    Depression Paternal Grandmother    Migraines Paternal Grandmother    Heart attack Paternal Grandfather 18   Depression Daughter    Anxiety disorder Daughter    ADD / ADHD Daughter    ADD / ADHD Son    Hypothyroidism  Son    ADD / ADHD Son    Angioedema Neg Hx    Allergic rhinitis Neg Hx    Immunodeficiency Neg Hx    Urticaria Neg Hx    Eczema Neg Hx    Social History   Social History Narrative   Marital status/children/pets: Married.  3 children.   Education/employment: College-educated.  Owner/CFO-employed   Safety:      -Wears a bicycle helmet riding a bike: Yes     -smoke alarm in the home:Yes     - wears seatbelt: Yes     - Feels safe in their relationships: Yes       Allergies as of 04/06/2022       Reactions   Oxycodone Other (See Comments)   Severe headache Severe headache Severe headache   Tapentadol Rash        Medication List        Accurate as of April 06, 2022  9:27 AM. If you have any questions, ask your nurse or doctor.          STOP taking these medications    meloxicam 15 MG tablet Commonly known as: MOBIC Stopped by: Felix Pacini, DO   Symbicort 160-4.5 MCG/ACT inhaler Generic drug: budesonide-formoterol Stopped by: Felix Pacini, DO       TAKE these medications    albuterol 108 (90 Base) MCG/ACT inhaler Commonly known as: VENTOLIN HFA Inhale 2 puffs into the lungs every 6 (six) hours as needed for wheezing or shortness of breath.   EPINEPHrine 0.3 mg/0.3 mL Soaj injection Commonly known as: Auvi-Q Inject 0.3 mg into the muscle as needed for anaphylaxis.   Fish Oil 1000 MG Caps Take by mouth.   levocetirizine 5 MG tablet Commonly known as: XYZAL Take 1 tablet (5 mg total) by mouth daily.   montelukast 10 MG tablet Commonly known as: SINGULAIR Take 1 tablet (10 mg total) by mouth at bedtime.   triamcinolone ointment 0.1 % Commonly known as: KENALOG Apply 1 application topically 2 (two) times daily as needed.   Vitamin D (Ergocalciferol) 1.25 MG (50000 UNIT) Caps capsule Commonly known as: DRISDOL Take 1 capsule (50,000 Units total) by mouth every 7 (seven) days.        All past medical history, surgical history, allergies,  family history, immunizations andmedications were updated in the EMR today and reviewed under the history and medication portions of their EMR.      ROS 14 pt review of systems performed and negative (unless mentioned in an HPI)  Objective: BP 119/76   Pulse  82   Temp 98.6 F (37 C)   Ht 5' 10.5" (1.791 m)   Wt 236 lb 6.4 oz (107.2 kg)   SpO2 95%   BMI 33.44 kg/m  Physical Exam Vitals and nursing note reviewed.  Constitutional:      General: She is not in acute distress.    Appearance: Normal appearance. She is not ill-appearing or toxic-appearing.  HENT:     Head: Normocephalic and atraumatic.     Right Ear: Tympanic membrane, ear canal and external ear normal. There is no impacted cerumen.     Left Ear: Tympanic membrane, ear canal and external ear normal. There is no impacted cerumen.     Nose: No congestion or rhinorrhea.     Mouth/Throat:     Mouth: Mucous membranes are moist.     Pharynx: Oropharynx is clear. No oropharyngeal exudate or posterior oropharyngeal erythema.  Eyes:     General: No scleral icterus.       Right eye: No discharge.        Left eye: No discharge.     Extraocular Movements: Extraocular movements intact.     Conjunctiva/sclera: Conjunctivae normal.     Pupils: Pupils are equal, round, and reactive to light.  Cardiovascular:     Rate and Rhythm: Normal rate and regular rhythm.     Pulses: Normal pulses.     Heart sounds: Normal heart sounds. No murmur heard.    No friction rub. No gallop.  Pulmonary:     Effort: Pulmonary effort is normal. No respiratory distress.     Breath sounds: Normal breath sounds. No stridor. No wheezing, rhonchi or rales.  Chest:     Chest wall: No tenderness.  Abdominal:     General: Abdomen is flat. Bowel sounds are normal. There is no distension.     Palpations: Abdomen is soft. There is no mass.     Tenderness: There is no abdominal tenderness. There is no right CVA tenderness, left CVA tenderness, guarding or  rebound.     Hernia: No hernia is present.  Musculoskeletal:        General: No swelling, tenderness or deformity. Normal range of motion.     Cervical back: Normal range of motion and neck supple. No rigidity or tenderness.     Right lower leg: No edema.     Left lower leg: No edema.  Lymphadenopathy:     Cervical: No cervical adenopathy.  Skin:    General: Skin is warm and dry.     Coloration: Skin is not jaundiced or pale.     Findings: No bruising, erythema, lesion or rash.  Neurological:     General: No focal deficit present.     Mental Status: She is alert and oriented to person, place, and time. Mental status is at baseline.     Cranial Nerves: No cranial nerve deficit.     Sensory: No sensory deficit.     Motor: No weakness.     Coordination: Coordination normal.     Gait: Gait normal.     Deep Tendon Reflexes: Reflexes normal.  Psychiatric:        Mood and Affect: Mood normal.        Behavior: Behavior normal.        Thought Content: Thought content normal.        Judgment: Judgment normal.       No results found.  Assessment/plan: Caroline Gill is a 46 y.o. female present for CPE  Need for hepatitis C screening test Screening completed toda CRP elevated Feeling much improved with avoiding gluten and following a low glycemic index/lean meat diet. Has been referred to rheumatology, which encouraged her to continue diet and if needing food sensitivity testing will check with her allergist. Encounter for long-term (current) use of medications - CBC with Differential/Platelet - Hemoglobin A1c Obesity (BMI 30-39.9) - Lipid panel Vitamin D deficiency Feeling much improved on the high-dose vitamin D weekly. - Vitamin D (25 hydroxy) Low iron stores - Iron, TIBC and Ferritin Panel Routine general medical examination at a health care facility Colonoscopy: completed UTD 11/2020 [facility]- 10 yr Mammogram: completed:UTD 2023- GYN Cervical cancer screening: UTD 2023-  gyn Immunizations: tdap UTD 2023, Influenza declined (encouraged yearly) Infectious disease screening: HIV completed pregnancy, Hep C screen DEXA: routine screen  Patient was encouraged to exercise greater than 150 minutes a week. Patient was encouraged to choose a diet filled with fresh fruits and vegetables, and lean meats. AVS provided to patient today for education/recommendation on gender specific health and safety maintenance.  Return in about 1 year (around 04/08/2023) for cpe (20 min).  Orders Placed This Encounter  Procedures   CBC with Differential/Platelet   Lipid panel   Vitamin D (25 hydroxy)   Hemoglobin A1c   Iron, TIBC and Ferritin Panel   Hepatitis C Antibody    Orders Placed This Encounter  Procedures   CBC with Differential/Platelet   Lipid panel   Vitamin D (25 hydroxy)   Hemoglobin A1c   Iron, TIBC and Ferritin Panel   Hepatitis C Antibody   No orders of the defined types were placed in this encounter.  Referral Orders  No referral(s) requested today     Electronically signed by: Howard Pouch, [place]

## 2022-04-06 NOTE — Patient Instructions (Addendum)

## 2022-04-07 ENCOUNTER — Telehealth: Payer: Self-pay | Admitting: Family Medicine

## 2022-04-07 LAB — IRON,TIBC AND FERRITIN PANEL
%SAT: 15 % (calc) — ABNORMAL LOW (ref 16–45)
Ferritin: 120 ng/mL (ref 16–232)
Iron: 51 ug/dL (ref 40–190)
TIBC: 334 mcg/dL (calc) (ref 250–450)

## 2022-04-07 NOTE — Telephone Encounter (Signed)
LM for pt to returncall

## 2022-04-07 NOTE — Telephone Encounter (Signed)
Please call patient  Her white blood cells are now normal as well. Diabetes screening/A1c is normal  Cholesterol panel looks good and is at goal for her with an LDL/bad cholesterol of 116. Vitamin D looks great-great improvement in numbers. level now at 44.8.  Once prescribed vitamin D is completed I would encourage her to take over-the-counter vitamin D3 3000 units daily to maintain levels. Iron levels have improved a small margin.  There are now above 50 in the blood and her iron stores have improved. Hep c screen is pending. We will call her if abnormal results, otherwise we will pend message to her mychart

## 2022-04-08 LAB — HEPATITIS C ANTIBODY: Hepatitis C Ab: NONREACTIVE

## 2022-04-08 NOTE — Telephone Encounter (Signed)
MyChart message read.

## 2022-04-27 ENCOUNTER — Ambulatory Visit: Payer: 59 | Admitting: Internal Medicine

## 2022-05-28 ENCOUNTER — Ambulatory Visit: Payer: 59 | Admitting: Allergy & Immunology

## 2022-05-28 ENCOUNTER — Other Ambulatory Visit: Payer: Self-pay

## 2022-05-28 ENCOUNTER — Encounter: Payer: Self-pay | Admitting: Allergy & Immunology

## 2022-05-28 VITALS — BP 120/80 | HR 82 | Temp 98.0°F | Resp 16

## 2022-05-28 DIAGNOSIS — R7982 Elevated C-reactive protein (CRP): Secondary | ICD-10-CM

## 2022-05-28 DIAGNOSIS — D729 Disorder of white blood cells, unspecified: Secondary | ICD-10-CM

## 2022-05-28 DIAGNOSIS — J302 Other seasonal allergic rhinitis: Secondary | ICD-10-CM

## 2022-05-28 DIAGNOSIS — J3089 Other allergic rhinitis: Secondary | ICD-10-CM

## 2022-05-28 DIAGNOSIS — T781XXD Other adverse food reactions, not elsewhere classified, subsequent encounter: Secondary | ICD-10-CM

## 2022-05-28 MED ORDER — TRIAMCINOLONE ACETONIDE 0.1 % EX OINT: 1.0000 | TOPICAL_OINTMENT | Freq: Two times a day (BID) | CUTANEOUS | 5 refills | Status: DC | PRN

## 2022-05-28 NOTE — Progress Notes (Unsigned)
FOLLOW UP  Date of Service/Encounter:  05/28/22   Assessment:   Allergic reaction - unclear trigger (currently under good control with the "carnivore diet")    Seasonal and perennial allergic rhinitis (grasses, weeds, ragweed, trees, dog) - was on allergen immunotherapy in the past with multiple large local reactions   Possible contact dermatitis (fragrance mix, paraben mix, gold, formaldehyde, Stress away essential oil) - requested a "chemical exposure test" today, and explained that I did not have any test like this    Elevated CRP - normalized upon repeat (followed by Caroline Gill with a negative workup)   Neutrophilia - followed by Hematology/Oncology (Caroline Gill)   Supraventricular tachycardia  Plan/Recommendations:   Adverse food reaction - stable on carnivore diet - I will have to do some research into this. - I do not think that there is anything from our end that is going to help answer these questions.  - This is fascinating for sure!  - I am going to check the CRP and see where that is trending.   2. Seasonal and perennial allergic rhinitis (grasses, ragweed, weeds, trees and dog) - Continue with: Xyzal (levocetirizine) 5mg  tablet 1-2 times daily and Singulair (montelukast) 10mg  daily   3. Possible contact dermatitis (fragrance mix, paraben mix, gold, formaldehyde, Stress away essential oil) - Avoid triggers as tolerated.   4. Return in about 6 months (around 11/27/2022).    Subjective:   Caroline Gill is a 46 y.o. female presenting today for follow up of  Chief Complaint  Patient presents with   Follow-up   autoimmune    Caroline Gill has a history of the following: Patient Active Problem List   Diagnosis Date Noted   Positive ANA (antinuclear antibody) 03/18/2022   CRP elevated 03/18/2022   Polyarthralgia 01/23/2022   Elevated WBC count 01/07/2022   Encounter for long-term current use of medication 01/07/2022   Leg cramps 01/07/2022    Allergic reaction 03/04/2020   Extrinsic asthma 03/04/2020   Seasonal and perennial allergic rhinitis 03/04/2020   Allergic contact dermatitis 03/04/2020   Family history of hypercoagulability 03/03/2013    History obtained from: chart review and patient.  Caroline Gill is a 46 y.o. female presenting for a follow up visit.  She was last seen by Caroline Gill in June 2023.  At that time, she was having a lot of coughing.  Breathing test did not show obstruction, but her exhalation was longer.  She and her Symbicort was increased to 2 puffs twice daily.  She had a chest x-ray as well as steroids and antibiotics.  Chest x-ray was normal.  Since the last visit, she has has mostly done well.   She started having full body pain in July 2023. She was walking "like a 46yo woman". She went to see Dr. July 2023. She was referred to Rheumatology and she saw Dr. August 2023. She started the carnivore diet on August 24th and she become pain free. She takes a multivitamin to fill in gaps. She can eat fish but she does not like it much. She does a lot of steaks from [employer] and bacon. She has lost 30 pounds. Lipid panel was been normal.   She ate a spoonful of banana pudding which made her hurt for three days. She tries to be as consistent as she can on this diet since she feels so much better.   She had IgG testing for foods done that showed positives to many legumes. She eats some Sheliah Gill nuts  for selenium. She will have 4-6 Macadamia nuts as a treat for herself.   She uses Mobic as needed for pain. She does not use anything stronger for her pain.   Otherwise, there have been no changes to her past medical history, surgical history, family history, or social history.    Review of Systems  Constitutional: Negative.  Negative for chills, fever, malaise/fatigue and weight loss.  HENT: Negative.  Negative for congestion, ear discharge and ear pain.   Eyes:  Negative for pain, discharge and redness.  Respiratory:   Negative for cough, sputum production, shortness of breath and wheezing.   Cardiovascular: Negative.  Negative for chest pain and palpitations.  Gastrointestinal:  Negative for abdominal pain, constipation, diarrhea, heartburn, nausea and vomiting.  Skin:  Positive for itching and rash.  Neurological:  Negative for dizziness and headaches.  Endo/Heme/Allergies:  Negative for environmental allergies. Does not bruise/bleed easily.       Objective:   Blood pressure 120/80, pulse 82, temperature 98 F (36.7 C), temperature source Temporal, resp. rate 16, SpO2 97 %. There is no height or weight on file to calculate BMI.    Physical Exam Vitals reviewed.  Constitutional:      Appearance: She is well-developed.     Comments: She appears to have lost some weight.   HENT:     Head: Normocephalic and atraumatic.     Right Ear: Tympanic membrane, ear canal and external ear normal.     Left Ear: Tympanic membrane, ear canal and external ear normal.     Nose: No nasal deformity, septal deviation, mucosal edema or rhinorrhea.     Right Turbinates: Enlarged and swollen.     Left Turbinates: Enlarged and swollen.     Right Sinus: No maxillary sinus tenderness or frontal sinus tenderness.     Left Sinus: No maxillary sinus tenderness or frontal sinus tenderness.     Comments: No nasal polyps noted.  Turbinates erythematous.  No epistaxis.    Mouth/Throat:     Mouth: Mucous membranes are not pale and not dry.     Pharynx: Uvula midline.  Eyes:     General: Lids are normal. No allergic shiner.       Right eye: No discharge.        Left eye: No discharge.     Conjunctiva/sclera: Conjunctivae normal.     Right eye: Right conjunctiva is not injected. No chemosis.    Left eye: Left conjunctiva is not injected. No chemosis.    Pupils: Pupils are equal, round, and reactive to light.  Cardiovascular:     Rate and Rhythm: Normal rate and regular rhythm.     Heart sounds: Normal heart sounds.   Pulmonary:     Effort: Pulmonary effort is normal. No tachypnea, accessory muscle usage or respiratory distress.     Breath sounds: Normal breath sounds. No wheezing, rhonchi or rales.     Comments: Moving air well in all lung fields.  No increased work of breathing. Chest:     Chest wall: No tenderness.  Lymphadenopathy:     Cervical: No cervical adenopathy.  Skin:    General: Skin is warm.     Capillary Refill: Capillary refill takes less than 2 seconds.     Coloration: Skin is not pale.     Findings: No abrasion, erythema, petechiae or rash. Rash is not papular, urticarial or vesicular.     Comments: Faint erythema, but overall fairly clear skin.  She does have  an both of her hands and arms.  Neurological:     Mental Status: She is alert.  Psychiatric:        Behavior: Behavior is cooperative.      Diagnostic studies: labs sent instead      Malachi Bonds, MD  [facility]

## 2022-05-28 NOTE — Patient Instructions (Addendum)
Adverse food reaction - stable on carnivore diet - I will have to do some research into this. - I do not think that there is anything from our end that is going to help answer these questions.  - This is fascinating for sure!  - I am going to check the CRP and see where that is trending.   2. Seasonal and perennial allergic rhinitis (grasses, ragweed, weeds, trees and dog) - Continue with: Xyzal (levocetirizine) 5mg  tablet 1-2 times daily and Singulair (montelukast) 10mg  daily   3. Possible contact dermatitis (fragrance mix, paraben mix, gold, formaldehyde, Stress away essential oil) - Avoid triggers as tolerated.   4. Return in about 6 months (around 11/27/2022).    Please inform of any Emergency Department visits, hospitalizations, or changes in symptoms. Call 11/29/2022 before going to the ED for breathing or allergy symptoms since we might be able to fit you in for a sick visit. Feel free to contact us anytime with any questions, problems, or concerns.  It was a pleasure to see you again today!  Websites that have reliable patient information: 1. American Academy of Asthma, Allergy, and Immunology: www.aaaai.org 2. Food Allergy Research and Education (FARE): foodallergy.org 3. Mothers of Asthmatics: http://www.asthmacommunitynetwork.org 4. American College of Allergy, Asthma, and Immunology: www.acaai.org   COVID-19 Vaccine Information can be found at: [place] For questions related to vaccine distribution or appointments, please email [email or web address] or call [phone].   We realize that you might be concerned about having an allergic reaction to the COVID19 vaccines. To help with that concern, WE ARE OFFERING THE COVID19 VACCINES IN OUR OFFICE! Ask the front desk for dates!     "Like" [email or web address] on Facebook and Instagram for our latest updates!      A healthy democracy works best when [phone] participate!  Make sure you are registered to vote! If you have moved or changed any of your contact information, you will need to get this updated before voting!  In some cases, you MAY be able to register to vote online: [place]

## 2022-06-02 ENCOUNTER — Encounter: Payer: Self-pay | Admitting: Allergy & Immunology

## 2022-06-02 ENCOUNTER — Telehealth: Payer: Self-pay

## 2022-06-02 MED ORDER — RYVENT 6 MG PO TABS: 6.0000 mg | ORAL_TABLET | Freq: Two times a day (BID) | ORAL | 5 refills | Status: DC | PRN

## 2022-06-02 NOTE — Telephone Encounter (Signed)
PA is needed for Ryvent. Patient has tried xyzal and carbinoxamine maleate in the past.

## 2022-06-02 NOTE — Telephone Encounter (Signed)
Sent to PA team to start the PA for Ryvent.

## 2022-06-03 ENCOUNTER — Encounter: Payer: Self-pay | Admitting: Allergy & Immunology

## 2022-06-04 ENCOUNTER — Other Ambulatory Visit (HOSPITAL_COMMUNITY): Payer: Self-pay

## 2022-06-04 ENCOUNTER — Telehealth: Payer: Self-pay

## 2022-06-04 LAB — VITAMIN D 1,25 DIHYDROXY
Vitamin D 1, 25 (OH)2 Total: 32 pg/mL
Vitamin D2 1, 25 (OH)2: <10 pg/mL
Vitamin D3 1, 25 (OH)2: 26 pg/mL

## 2022-06-04 LAB — LIPID PANEL
Chol/HDL Ratio: 3.2 ratio (ref 0.0–4.4)
Cholesterol, Total: 198 mg/dL (ref 100–199)
HDL: 61 mg/dL (ref >39)
LDL Chol Calc (NIH): 120 mg/dL — ABNORMAL HIGH (ref 0–99)
Triglycerides: 94 mg/dL (ref 0–149)
VLDL Cholesterol Cal: 17 mg/dL (ref 5–40)

## 2022-06-04 LAB — C-REACTIVE PROTEIN: CRP: 14 mg/L — ABNORMAL HIGH (ref 0–10)

## 2022-06-04 NOTE — Telephone Encounter (Signed)
PA request received via Prescriber Office for RyVent 6MG  tablets  PA started and submitted through CMM to [facility].  PA has been APPROVED from 06/04/2022-06/05/2023.

## 2022-06-09 ENCOUNTER — Other Ambulatory Visit (HOSPITAL_COMMUNITY): Payer: Self-pay

## 2022-07-10 ENCOUNTER — Ambulatory Visit (INDEPENDENT_AMBULATORY_CARE_PROVIDER_SITE_OTHER): Payer: 59 | Admitting: Family Medicine

## 2022-07-10 ENCOUNTER — Encounter: Payer: Self-pay | Admitting: Family Medicine

## 2022-07-10 ENCOUNTER — Telehealth: Payer: Self-pay | Admitting: Family Medicine

## 2022-07-10 VITALS — BP 118/79 | HR 72 | Temp 98.6°F | Wt 242.8 lb

## 2022-07-10 DIAGNOSIS — M542 Cervicalgia: Secondary | ICD-10-CM

## 2022-07-10 DIAGNOSIS — R42 Dizziness and giddiness: Secondary | ICD-10-CM

## 2022-07-10 MED ORDER — PREDNISONE 20 MG PO TABS: ORAL_TABLET | ORAL | 0 refills | Status: DC

## 2022-07-10 MED ORDER — CYCLOBENZAPRINE HCL 5 MG PO TABS: 5.0000 mg | ORAL_TABLET | Freq: Two times a day (BID) | ORAL | 0 refills | Status: DC | PRN

## 2022-07-10 NOTE — Telephone Encounter (Signed)
Pt calls in and ask to speak with a nurse. She tells me that she has been dizzy all week. I informed her that unfortunately we did not have an appointments available today and I would go to an urgent care. I told her I would  have someone give her a call. Please advise patient. She kept saying she thought it would get better but realized this morning it was bad and that she should give [place] a call.

## 2022-07-10 NOTE — Patient Instructions (Signed)
Return in about 2 weeks (around 07/24/2022), or if symptoms worsen or fail to improve.        Great to see you today.  I have refilled the medication(s) we provide.   If labs were collected, we will inform you of lab results once received either by echart message or telephone call.   - echart message- for normal results that have been seen by the patient already.   - telephone call: abnormal results or if patient has not viewed results in their echart.  

## 2022-07-10 NOTE — Telephone Encounter (Signed)
Spoke with patient regarding results/recommendations.  

## 2022-07-10 NOTE — Progress Notes (Unsigned)
Caroline Gill , [birth year], 47 y.o., female MRN: [id number] Patient Care Team    Relationship Specialty Notifications Start End  Natalia Leatherwood, DO PCP - General Family Medicine  01/07/22   Aletha Halim, MD Consulting Physician Neurology  01/07/22   Ronda Fairly. Nicanor Bake, MD Consulting Physician Cardiology  01/07/22   Verlee Monte, MD Consulting Physician Allergy and Immunology  01/07/22   Edwyna Shell, MD Referring Physician Hematology and Oncology  01/07/22   Rolland Bimler T  Gastroenterology  01/07/22    Comment: Concepcion Living, MD Referring Physician Pulmonary Disease  01/07/22     Chief Complaint  Patient presents with   Dizziness    Started after having adjustment done on monday     Subjective: Pt presents for an OV with complaints of dizziness today that started 4-5 days ago after having chiropractic adjustment on her cervical spine.  She reports she reached out to the chiropractor to discuss and they did not have much to offer. Reports she feels unsteady on her feet when she moves her head.  She does not seem to notice if turning her head in any direction particular makes the unsteadiness worse.  She is still having thoracic back discomfort.  She denies any fever, chills, nausea or vomiting.  She denies any recent illness.    04/06/2022    8:45 AM 01/07/2022   10:02 AM  Depression screen PHQ 2/9  Decreased Interest 0 3  Down, Depressed, Hopeless 0 1  PHQ - 2 Score 0 4  Altered sleeping 1   Tired, decreased energy 1   Change in appetite 0   Feeling bad or failure about yourself  1   Trouble concentrating 1   Moving slowly or fidgety/restless 0   Suicidal thoughts 0   PHQ-9 Score 4     Allergies  Allergen Reactions   Oxycodone Other (See Comments)    Severe headache Severe headache Severe headache   Tapentadol Rash   Social History   Social History Narrative   Marital status/children/pets: Married.  3 children.   Education/employment:  College-educated.  Owner/CFO-employed   Safety:      -Wears a bicycle helmet riding a bike: Yes     -smoke alarm in the home:Yes     - wears seatbelt: Yes     - Feels safe in their relationships: Yes      Past Medical History:  Diagnosis Date   Arthritis    Asthma    Atrial fibrillation (HCC)    Chicken pox    Depression    Migraine    SVT (supraventricular tachycardia)    Past Surgical History:  Procedure Laterality Date   BREAST BIOPSY  2020   CHOLECYSTECTOMY  2013   ENDOMETRIAL ABLATION  2013   KNEE ARTHROSCOPY Right 2018   meniscus   KNEE SURGERY Left 1992   "dislox"   SHOULDER ARTHROSCOPY  2018   Labral tear with anchors x3   VAGINAL DELIVERY     X3, 1996, 1999, 2002   Family History  Problem Relation Age of Onset   Skin cancer Mother    Asthma Mother    Osteoarthritis Mother    Thyroid disease Mother    Diabetes Mother    Stroke Mother    Atrial fibrillation Mother    Alcohol abuse Mother    Depression Mother    Hypertension Father    Osteoarthritis Father    Diabetes Father  Atrial fibrillation Father    Stroke Father    Hypertension Brother    Osteoarthritis Brother    Alcohol abuse Brother    Asthma Brother    Drug abuse Brother    Skin cancer Brother    Hypertension Maternal Grandmother    Diabetes Maternal Grandmother    Depression Maternal Grandmother    Osteoarthritis Maternal Grandmother    Miscarriages / Stillbirths Maternal Grandmother    Bone cancer Maternal Grandfather        late 62s   Dementia Maternal Grandfather    Rheum arthritis Paternal Grandmother    Depression Paternal Grandmother    Migraines Paternal Grandmother    Heart attack Paternal Grandfather 23   Depression Daughter    Anxiety disorder Daughter    ADD / ADHD Daughter    ADD / ADHD Son    Hypothyroidism Son    ADD / ADHD Son    Angioedema Neg Hx    Allergic rhinitis Neg Hx    Immunodeficiency Neg Hx    Urticaria Neg Hx    Eczema Neg Hx    Allergies as  of 07/10/2022       Reactions   Oxycodone Other (See Comments)   Severe headache Severe headache Severe headache   Tapentadol Rash        Medication List        Accurate as of July 10, 2022 11:59 PM. If you have any questions, ask your nurse or doctor.          albuterol 108 (90 Base) MCG/ACT inhaler Commonly known as: VENTOLIN HFA Inhale 2 puffs into the lungs every 6 (six) hours as needed for wheezing or shortness of breath.   cyclobenzaprine 5 MG tablet Commonly known as: FLEXERIL Take 1 tablet (5 mg total) by mouth 2 (two) times daily as needed for muscle spasms. Started by: Howard Pouch, DO   EPINEPHrine 0.3 mg/0.3 mL Soaj injection Commonly known as: Auvi-Q Inject 0.3 mg into the muscle as needed for anaphylaxis.   Fish Oil 1000 MG Caps Take by mouth.   levocetirizine 5 MG tablet Commonly known as: XYZAL Take 1 tablet (5 mg total) by mouth daily.   montelukast 10 MG tablet Commonly known as: SINGULAIR Take 1 tablet (10 mg total) by mouth at bedtime.   predniSONE 20 MG tablet Commonly known as: DELTASONE 60 mg x2d, 40 mg x3d, 20 mg x2d, 10 mg x2d Started by: Howard Pouch, DO   RyVent 6 MG Tabs Generic drug: Carbinoxamine Maleate Take 6 mg by mouth 2 (two) times daily as needed.   triamcinolone ointment 0.1 % Commonly known as: KENALOG Apply 1 Application topically 2 (two) times daily as needed.        All past medical history, surgical history, allergies, family history, immunizations andmedications were updated in the EMR today and reviewed under the history and medication portions of their EMR.     ROS Negative, with the exception of above mentioned in HPI   Objective:  BP 118/79   Pulse 72   Temp 98.6 F (37 C)   Wt 242 lb 12.8 oz (110.1 kg)   SpO2 99%   BMI 34.35 kg/m  Body mass index is 34.35 kg/m. Physical Exam Vitals and nursing note reviewed.  Constitutional:      General: She is not in acute distress.    Appearance:  Normal appearance. She is not ill-appearing, toxic-appearing or diaphoretic.  HENT:     Head: Normocephalic and atraumatic.  Eyes:     General: No scleral icterus.       Right eye: No discharge.        Left eye: No discharge.     Extraocular Movements: Extraocular movements intact.     Conjunctiva/sclera: Conjunctivae normal.     Pupils: Pupils are equal, round, and reactive to light.  Cardiovascular:     Rate and Rhythm: Normal rate and regular rhythm.  Pulmonary:     Effort: Pulmonary effort is normal. No respiratory distress.     Breath sounds: Normal breath sounds. No wheezing, rhonchi or rales.  Musculoskeletal:        General: Normal range of motion.     Cervical back: Spasms present. No swelling, rigidity, torticollis, tenderness or bony tenderness. No pain with movement. Normal range of motion.     Comments: Muscle spasm/ropiness right paraspinal.  No bony tenderness.  Full range of motion without discomfort or reproduction of symptoms.  Skin:    General: Skin is warm.     Findings: No rash.  Neurological:     Mental Status: She is alert and oriented to person, place, and time. Mental status is at baseline.     Motor: No weakness.     Gait: Gait normal.  Psychiatric:        Mood and Affect: Mood normal.        Behavior: Behavior normal.        Thought Content: Thought content normal.        Judgment: Judgment normal.     No results found. No results found. No results found for this or any previous visit (from the past 24 hour(s)).  Assessment/Plan: Alexsus Papadopoulos is a 47 y.o. female present for OV for  Neck pain/vertigo Patient had neck discomfort and upper back discomfort.  She was seen by her normal chiropractor who adjusted her cervical spine and back. She noticed mild gait unsteadiness/slight room movement like symptoms a few days after receiving treatment. Vitals are stable.  Range of motion cervical spine is normal today without reproduction of vertigo.  Gait  is normal today. She does have a spasm present on the right cervical paraspinal muscle group. Flexeril and prednisone prescribed. We discussed OMT and she would like referral at this time. - Ambulatory referral to Sports Medicine   Reviewed expectations re: course of current medical issues. Discussed self-management of symptoms. Outlined signs and symptoms indicating need for more acute intervention. Patient verbalized understanding and all questions were answered. Patient received an After-Visit Summary.    Orders Placed This Encounter  Procedures   Ambulatory referral to Sports Medicine   Meds ordered this encounter  Medications   predniSONE (DELTASONE) 20 MG tablet    Sig: 60 mg x2d, 40 mg x3d, 20 mg x2d, 10 mg x2d    Dispense:  15 tablet    Refill:  0   cyclobenzaprine (FLEXERIL) 5 MG tablet    Sig: Take 1 tablet (5 mg total) by mouth 2 (two) times daily as needed for muscle spasms.    Dispense:  60 tablet    Refill:  0   Referral Orders         Ambulatory referral to Sports Medicine       Note is dictated utilizing voice recognition software. Although note has been proof read prior to signing, occasional typographical errors still can be missed. If any questions arise, please do not hesitate to call for verification.   electronically signed by:  Howard Pouch,  DO  [facility] [place]

## 2022-07-13 ENCOUNTER — Encounter: Payer: Self-pay | Admitting: Family Medicine

## 2022-07-14 NOTE — Progress Notes (Signed)
Caroline Gill [name] [address] [place] [address] Phone: [phone]   Assessment and Plan:     1. Neck pain - Chronic with exacerbation, complicated, initial sports medicine visit - Patient's neck pain is most consistent with an acute on chronic strain of right paraspinal and right trapezius musculature, likely occurring during a chiropractic treatment last week. - Patient's dizziness appears to be more likely vestibular related and less likely to be cervicogenic based on physical exam and special testing at today's office visit.  I encourage patient to continue workup for her dizziness - Continue and complete course of prednisone.  After completing prednisone, start meloxicam 15 mg daily for 2 weeks.  Patient had prescription of meloxicam, so did not need a refill at today's visit - May continue muscle relaxer as needed - Start HEP for neck and trapezius - May continue massages, though I recommend against chiropractic treatment at this time -Encouraged ergonomic changes including resting arms at desk to decrease chronic neck pain  2. Dizzy -Acute, complicated, initial visit - Unclear etiology of patient's continued dizziness with positive vestibular testing on physical exam today - Patient does report past medical history of Arnold-Chiari malformation, though CT head showed cerebellar tonsillar is at but not significantly below foramen magnum as well as CT head with findings suspicious for idiopathic intracranial hypertension including "partially empty sella, unusual for this age group.  Also for age, the sulci and ventricles and basilar cisterns are small.  Slightly low-lying cerebellar tonsils.  All of these findings can be normal, but the constellation of findings suggest the possibility of IIH." - Recommend following up with ophthalmology as well as neurology to further evaluate  Pertinent previous records reviewed include ER note  07/14/2022, CT head 07/14/2022, family medicine note 07/10/2022   Follow Up: 3 weeks for reevaluation.  If overall improvement in symptoms, could consider OMT.  If no improvement, would obtain C-spine x-ray   Subjective:   I, Caroline Gill, am serving as a Neurosurgeon for Doctor Richardean Sale  Chief Complaint: neck pain   HPI:   07/15/2022 Patient is a 47 year old female complaining of neck pain. Patient states that she had a chiro adjustment last week and has pain and dizziness since, went to the ED a couple of day ago,  she feels unsteady on her feet when she moves her head. She does not seem to notice if turning her head in any direction particular makes the unsteadiness worse. She is still having thoracic back discomfort.   Relevant Historical Information: Weakly positive ANA, Arnold-Chiari malformation  Additional pertinent review of systems negative.   Current Outpatient Medications:    albuterol (VENTOLIN HFA) 108 (90 Base) MCG/ACT inhaler, Inhale 2 puffs into the lungs every 6 (six) hours as needed for wheezing or shortness of breath., Disp: 18 g, Rfl: 3   cyclobenzaprine (FLEXERIL) 5 MG tablet, Take 1 tablet (5 mg total) by mouth 2 (two) times daily as needed for muscle spasms., Disp: 60 tablet, Rfl: 0   EPINEPHrine (AUVI-Q) 0.3 mg/0.3 mL IJ SOAJ injection, Inject 0.3 mg into the muscle as needed for anaphylaxis., Disp: 1 each, Rfl: 1   levocetirizine (XYZAL) 5 MG tablet, Take 1 tablet (5 mg total) by mouth daily., Disp: 90 tablet, Rfl: 3   montelukast (SINGULAIR) 10 MG tablet, Take 1 tablet (10 mg total) by mouth at bedtime., Disp: 90 tablet, Rfl: 3   Omega-3 Fatty Acids (FISH OIL) 1000 MG CAPS,  Take by mouth., Disp: , Rfl:    predniSONE (DELTASONE) 20 MG tablet, 60 mg x2d, 40 mg x3d, 20 mg x2d, 10 mg x2d, Disp: 15 tablet, Rfl: 0   RYVENT 6 MG TABS, Take 6 mg by mouth 2 (two) times daily as needed., Disp: 60 tablet, Rfl: 5   triamcinolone ointment (KENALOG) 0.1 %, Apply 1  Application topically 2 (two) times daily as needed., Disp: 30 g, Rfl: 5   Objective:     Vitals:   07/15/22 0852  BP: 120/78  Pulse: 72  SpO2: 99%  Weight: 239 lb (108.4 kg)  Height: 5\' 10"  (1.778 m)      Body mass index is 34.29 kg/m.    Physical Exam:    Cervical Spine: Posture normal Skin: normal, intact  Neurological:   Strength:  Right  Left   Deltoid (C5) 5/5 5/5  Bicep/Brachioradialis (C5/6) 5/5  5/5  Wrist Extension (C6) 5/5 5/5  Tricep (C7) 5/5 5/5  Wrist Flexion (C7) 5/5 5/5  Grip (C8) 5/5 5/5  Finger Abduction (T1) 5/5 5/5   Sensation: intact to light touch in upper extremities bilaterally  Spurling's:  negative bilaterally Neck ROM: Full active ROM, with right-sided tightness throughout ROM TTP: Right cervical paraspinal, right trapezius, cervical spinous processes 5-7 NTTP: left cervical paraspinal, thoracic paraspinal, left trapezius   mVOMS:   - Baseline symptoms: 0 - Horizontal Vestibular-Ocular Reflex: Dizzy 3/10  - Smooth pursuits: Dizzy 3/10  - Horizontal Saccades: Dizzy 5/10  - Visual Motion Sensitivity Test: Dizzy 7/10  - Convergence: 4,4cm (<5 cm normal)   Complex Tandem Gait: - Forward, eyes open: 1 errors - Backward, eyes open: 1 errors - Forward, eyes closed: stopped due to dizziness - Backward, eyes closed: not performed due to dizziness  Electronically signed by:  Caroline Gill [name] 9:49 AM 07/15/22

## 2022-07-15 ENCOUNTER — Telehealth: Payer: Self-pay

## 2022-07-15 ENCOUNTER — Ambulatory Visit (INDEPENDENT_AMBULATORY_CARE_PROVIDER_SITE_OTHER): Payer: 59 | Admitting: Sports Medicine

## 2022-07-15 VITALS — BP 120/78 | HR 72 | Ht 70.0 in | Wt 239.0 lb

## 2022-07-15 DIAGNOSIS — R42 Dizziness and giddiness: Secondary | ICD-10-CM

## 2022-07-15 DIAGNOSIS — M542 Cervicalgia: Secondary | ICD-10-CM

## 2022-07-15 NOTE — Telephone Encounter (Signed)
Transition Care Management Follow-up Telephone Call Date of discharge and from where: [facility] 07/14/22 How have you been since you were released from the hospital? Pt states that she is still dizzy Any questions or concerns? No  Items Reviewed: Did the pt receive and understand the discharge instructions provided? Yes  Medications obtained and verified? Yes  Other? No  Any new allergies since your discharge? No  Dietary orders reviewed? Yes Do you have support at home? Yes   Home Care and Equipment/Supplies: Were home health services ordered? not applicable If so, what is the name of the agency? N/A  Has the agency set up a time to come to the patient's home? not applicable Were any new equipment or medical supplies ordered?  No What is the name of the medical supply agency? N/A Were you able to get the supplies/equipment? not applicable Do you have any questions related to the use of the equipment or supplies? No  Functional Questionnaire: (I = Independent and D = Dependent) ADLs: I  Bathing/Dressing- I  Meal Prep- I  Eating- I  Maintaining continence- I  Transferring/Ambulation- I  Managing Meds- I  Follow up appointments reviewed:  PCP Hospital f/u appt confirmed?  Pt wants to follow up with specialist before following up with PCP   [facility] f/u appt confirmed?  Pt saw sports medicine doctor today and is waiting on neurology to contact her to schedule.   Are transportation arrangements needed? No  If their condition worsens, is the pt aware to call PCP or go to the Emergency Dept.? Yes Was the patient provided with contact information for the PCP's office or ED? Yes Was to pt encouraged to call back with questions or concerns? Yes

## 2022-07-15 NOTE — Telephone Encounter (Signed)
Pt went to ED  [facility] RECORD [facility] Patient Name: Caroline Gill Gender: Female DOB: [birth year] Age: 47 Y 11 M 9 D Return Phone Number: [phone] (Primary) Address: City/ State/ Zip: [place]  [address] Client [facility] - Client Client Site [place] - Day Provider Raoul Pitch, [place] Type Call Who Is Calling Patient / Member / Family / Caregiver Call Type Triage / Clinical Relationship To Patient Self Return Phone Number [phone] (Primary) Chief Complaint CHEST PAIN - pain, pressure, heaviness or tightness Reason for Call Symptomatic / Request for Health Information Initial Comment Caller is [name] with the office transferring caller: patient having dizziness and chest pains and neck pains, she was placed on steriods on Friday, caller also having brain fog [place] Not [facility] ED Translation No Nurse Assessment Nurse: Purcell Nails, RN, Hollie [place] Date/Time (Eastern Time): 07/14/2022 2:56:31 PM Confirm and document reason for call. If symptomatic, describe symptoms. ---Caller states she's having right/middle chest pressure 2/10 and neck pain 4/10. Has PCP appointment tomorrow. Dizziness and feeling faint with some activity today. Does the patient have any new or worsening symptoms? ---Yes Will a triage be completed? ---Yes Related visit to physician within the last 2 weeks? ---Yes Does the PT have any chronic conditions? (i.e. diabetes, asthma, this includes High risk factors for pregnancy, etc.) ---Yes List chronic conditions. ---AFIB, Hx of SVT not on meds, asthma Is the patient pregnant or possibly pregnant? (Ask all females between the ages of 30-55) ---No Is this a behavioral health or substance abuse call? ---No Guidelines Guideline Title Affirmed Question Affirmed Notes Nurse Date/Time (Eastern Time) Chest Pain [1] Chest pain  lasts > 5 minutes AND [2] age > 6 AND [3] one or more Purcell Nails, RN, Hollie Beach 07/14/2022 3:00:11 PM PLEASE NOTE: All timestamps contained within this report are represented as [place] Standard Time. CONFIDENTIALTY NOTICE: This fax transmission is intended only for the addressee. It contains information that is legally privileged, confidential or otherwise protected from use or disclosure. If you are not the intended recipient, you are strictly prohibited from reviewing, disclosing, copying using or disseminating any of this information or taking any action in reliance on or regarding this information. If you have received this fax in error, please notify us immediately by telephone so that we can arrange for its return to [place]. Phone: [phone], Toll-Free: [phone], Fax: [phone] Page: 2 of 2 Call Id: [id number] Guidelines Guideline Title Affirmed Question Affirmed Notes Nurse Date/Time Eilene Ghazi Time) cardiac risk factors (e.g., diabetes, high blood pressure, high cholesterol, smoker, or strong family history of heart disease) Disp. Time Eilene Ghazi Time) Disposition Final User 07/14/2022 2:54:09 PM Send to Urgent Leda Roys 07/14/2022 3:04:34 PM Call EMS 911 Now Yes Purcell Nails, RN, [place] 07/14/2022 3:06:42 PM [place], RN, Hollie Beach Reason: Patient refusing to call EMS 911. States spouse will drive her to ED now. Final Disposition 07/14/2022 3:04:34 PM Call EMS 911 Now Yes Purcell Nails, RN, [place] Disagree/Comply Disagree Caller Understands Yes PreDisposition InappropriateToAsk Care Advice Given Per Guideline CALL EMS 911 NOW: * Immediate medical attention is needed. You need to hang up and call 911 (or an ambulance). CARE ADVICE given per Chest Pain (Adult) guideline. Comments User: Darcus Pester, RN Date/Time Eilene Ghazi Time): 07/14/2022 3:05:47 PM Caller refusing to call 911 but will have husband take her to ED. Referrals GO  TO FACILITY OTHER - SPECIFY

## 2022-07-15 NOTE — Patient Instructions (Addendum)
Good to see you  Recommend seeing your eye doctor and neurologist Continue and complete muscle relaxer and prednisone  After your complete prednisone start meloxicam daily for 2 weeks  Neck trap HEP  3 week follow up

## 2022-07-16 ENCOUNTER — Encounter: Payer: Self-pay | Admitting: Family Medicine

## 2022-07-17 NOTE — Telephone Encounter (Signed)
If she is already established with a neurologist, then she would stay with the that group. New neuro referrals are typically denied if pt is est elsewhere and if they do agree to take her, it is usally 3-6 mos wait for new pts

## 2022-08-04 NOTE — Progress Notes (Unsigned)
    Benito Mccreedy D.[place] Phone: [phone]   Assessment and Plan:     There are no diagnoses linked to this encounter.  ***   Pertinent previous records reviewed include ***   Follow Up: ***     Subjective:   I, Vy Badley, am serving as a Education administrator for Doctor Glennon Mac   Chief Complaint: neck pain    HPI:    07/15/2022 Patient is a 47 year old female complaining of neck pain. Patient states that she had a chiro adjustment last week and has pain and dizziness since, went to the ED a couple of day ago,  she feels unsteady on her feet when she moves her head. She does not seem to notice if turning her head in any direction particular makes the unsteadiness worse. She is still having thoracic back discomfort.   08/05/2022 Patient states    Relevant Historical Information: Weakly positive ANA, Arnold-Chiari malformation  Additional pertinent review of systems negative.   Current Outpatient Medications:    albuterol (VENTOLIN HFA) 108 (90 Base) MCG/ACT inhaler, Inhale 2 puffs into the lungs every 6 (six) hours as needed for wheezing or shortness of breath., Disp: 18 g, Rfl: 3   cyclobenzaprine (FLEXERIL) 5 MG tablet, Take 1 tablet (5 mg total) by mouth 2 (two) times daily as needed for muscle spasms., Disp: 60 tablet, Rfl: 0   EPINEPHrine (AUVI-Q) 0.3 mg/0.3 mL IJ SOAJ injection, Inject 0.3 mg into the muscle as needed for anaphylaxis., Disp: 1 each, Rfl: 1   levocetirizine (XYZAL) 5 MG tablet, Take 1 tablet (5 mg total) by mouth daily., Disp: 90 tablet, Rfl: 3   montelukast (SINGULAIR) 10 MG tablet, Take 1 tablet (10 mg total) by mouth at bedtime., Disp: 90 tablet, Rfl: 3   Omega-3 Fatty Acids (FISH OIL) 1000 MG CAPS, Take by mouth., Disp: , Rfl:    predniSONE (DELTASONE) 20 MG tablet, 60 mg x2d, 40 mg x3d, 20 mg x2d, 10 mg x2d, Disp: 15 tablet, Rfl: 0   RYVENT 6 MG TABS, Take 6 mg by mouth 2  (two) times daily as needed., Disp: 60 tablet, Rfl: 5   triamcinolone ointment (KENALOG) 0.1 %, Apply 1 Application topically 2 (two) times daily as needed., Disp: 30 g, Rfl: 5   Objective:     There were no vitals filed for this visit.    There is no height or weight on file to calculate BMI.    Physical Exam:    ***   Electronically signed by:  Benito Mccreedy D.[name] 9:16 AM 08/04/22

## 2022-08-05 ENCOUNTER — Encounter: Payer: Self-pay | Admitting: Sports Medicine

## 2022-08-05 ENCOUNTER — Ambulatory Visit (INDEPENDENT_AMBULATORY_CARE_PROVIDER_SITE_OTHER): Payer: 59 | Admitting: Sports Medicine

## 2022-08-05 VITALS — BP 120/80 | HR 111 | Ht 70.0 in | Wt 242.0 lb

## 2022-08-05 DIAGNOSIS — M9902 Segmental and somatic dysfunction of thoracic region: Secondary | ICD-10-CM | POA: Diagnosis not present

## 2022-08-05 DIAGNOSIS — M9908 Segmental and somatic dysfunction of rib cage: Secondary | ICD-10-CM

## 2022-08-05 DIAGNOSIS — M9901 Segmental and somatic dysfunction of cervical region: Secondary | ICD-10-CM

## 2022-08-05 DIAGNOSIS — E236 Other disorders of pituitary gland: Secondary | ICD-10-CM

## 2022-08-05 DIAGNOSIS — R42 Dizziness and giddiness: Secondary | ICD-10-CM | POA: Diagnosis not present

## 2022-08-05 DIAGNOSIS — M9903 Segmental and somatic dysfunction of lumbar region: Secondary | ICD-10-CM

## 2022-08-05 DIAGNOSIS — M9905 Segmental and somatic dysfunction of pelvic region: Secondary | ICD-10-CM

## 2022-08-05 DIAGNOSIS — H9313 Tinnitus, bilateral: Secondary | ICD-10-CM

## 2022-08-05 DIAGNOSIS — R519 Headache, unspecified: Secondary | ICD-10-CM

## 2022-08-05 DIAGNOSIS — M542 Cervicalgia: Secondary | ICD-10-CM

## 2022-08-05 NOTE — Patient Instructions (Addendum)
Good to see you  Low back HEP  2 week follow up  Discontinue daily meloxicam and use remainder as needed

## 2022-08-18 NOTE — Progress Notes (Deleted)
    Caroline Gill Caroline Gill Caroline Gill Phone: [phone]   Assessment and Plan:     There are no diagnoses linked to this encounter.  ***   Pertinent previous records reviewed include ***   Follow Up: ***     Subjective:   I, Caroline Gill, am serving as a Education administrator for Caroline Gill   Chief Complaint: neck pain    HPI:    07/15/2022 Patient is a 47 year old female complaining of neck pain. Patient states that she had a chiro adjustment last week and has pain and dizziness since, went to the ED a couple of day ago,  she feels unsteady on her feet when she moves her head. She does not seem to notice if turning her head in any direction particular makes the unsteadiness worse. She is still having thoracic back discomfort.    08/05/2022 Patient states that she is better than it was , still has some dizziness, she was getting some catching when she does exercises , she feels like her atlas is out and she can hear heartbeat in her ear    08/19/2022 Patient states     Relevant Historical Information: Weakly positive ANA, Arnold-Chiari malformation Additional pertinent review of systems negative.   Current Outpatient Medications:    albuterol (VENTOLIN HFA) 108 (90 Base) MCG/ACT inhaler, Inhale 2 puffs into the lungs every 6 (six) hours as needed for wheezing or shortness of breath., Disp: 18 g, Rfl: 3   cyclobenzaprine (FLEXERIL) 5 MG tablet, Take 1 tablet (5 mg total) by mouth 2 (two) times daily as needed for muscle spasms., Disp: 60 tablet, Rfl: 0   EPINEPHrine (AUVI-Q) 0.3 mg/0.3 mL IJ SOAJ injection, Inject 0.3 mg into the muscle as needed for anaphylaxis., Disp: 1 each, Rfl: 1   levocetirizine (XYZAL) 5 MG tablet, Take 1 tablet (5 mg total) by mouth daily., Disp: 90 tablet, Rfl: 3   montelukast (SINGULAIR) 10 MG tablet, Take 1 tablet (10 mg total) by mouth at bedtime., Disp: 90 tablet, Rfl: 3   Omega-3  Fatty Acids (FISH OIL) 1000 MG CAPS, Take by mouth., Disp: , Rfl:    predniSONE (DELTASONE) 20 MG tablet, 60 mg x2d, 40 mg x3d, 20 mg x2d, 10 mg x2d, Disp: 15 tablet, Rfl: 0   RYVENT 6 MG TABS, Take 6 mg by mouth 2 (two) times daily as needed., Disp: 60 tablet, Rfl: 5   triamcinolone ointment (KENALOG) 0.1 %, Apply 1 Application topically 2 (two) times daily as needed., Disp: 30 g, Rfl: 5   Objective:     There were no vitals filed for this visit.    There is no height or weight on file to calculate BMI.    Physical Exam:    ***   Electronically signed by:  Caroline Gill D.[facility] 11:39 AM 08/18/22

## 2022-08-19 ENCOUNTER — Ambulatory Visit: Payer: 59 | Admitting: Sports Medicine

## 2022-08-26 NOTE — Progress Notes (Signed)
Benito Mccreedy D.[facility] [place] Phone: [phone]   Assessment and Plan:     1. Neck pain 2. Nonintractable headache, unspecified chronicity pattern, unspecified headache type 3. Somatic dysfunction of cervical region 4. Somatic dysfunction of thoracic region 5. Somatic dysfunction of lumbar region 6. Somatic dysfunction of pelvic region 7. Somatic dysfunction of rib region -Chronic with exacerbation, subsequent visit - Overall improvement in dizziness, headaches, tinnitus with OMT, corticosteroid neck injections for tension headaches, starting diuretic for increased intracranial pressure, though not diagnosed as IIH - Patient has received significant relief with OMT in the past.  Elects for repeat OMT today.  Tolerated well per note below. - Decision today to treat with OMT was based on Physical Exam  After verbal consent patient was treated with HVLA (high velocity low amplitude), ME (muscle energy), FPR (flex positional release), ST (soft tissue), PC/PD (Pelvic Compression/ Pelvic Decompression) techniques in cervical, rib, thoracic, lumbar, and pelvic areas. Patient tolerated the procedure well with improvement in symptoms.  Patient educated on potential side effects of soreness and recommended to rest, hydrate, and use Tylenol as needed for pain control.      Pertinent previous records reviewed include none   Follow Up: 4 weeks for reevaluation or sooner if needed.  Could consider repeat OMT   Subjective:   I, [name], am serving as a Education administrator for Doctor Glennon Mac   Chief Complaint: neck pain    HPI:    07/15/2022 Patient is a 47 year old female complaining of neck pain. Patient states that she had a chiro adjustment last week and has pain and dizziness since, went to the ED a couple of day ago,  she feels unsteady on her feet when she moves her head. She does not seem to notice if turning her  head in any direction particular makes the unsteadiness worse. She is still having thoracic back discomfort.    08/05/2022 Patient states that she is better than it was , still has some dizziness, she was getting some catching when she does exercises , she feels like her atlas is out and she can hear heartbeat in her ear    09/02/2022 Patient states having an autoimmune flare, she states she is getting better gets injections tomorrow , tightness isnt as bad as it usually is     Relevant Historical Information: Weakly positive ANA, Arnold-Chiari malformation  Additional pertinent review of systems negative.   Current Outpatient Medications:    albuterol (VENTOLIN HFA) 108 (90 Base) MCG/ACT inhaler, Inhale 2 puffs into the lungs every 6 (six) hours as needed for wheezing or shortness of breath., Disp: 18 g, Rfl: 3   cyclobenzaprine (FLEXERIL) 5 MG tablet, Take 1 tablet (5 mg total) by mouth 2 (two) times daily as needed for muscle spasms., Disp: 60 tablet, Rfl: 0   EPINEPHrine (AUVI-Q) 0.3 mg/0.3 mL IJ SOAJ injection, Inject 0.3 mg into the muscle as needed for anaphylaxis., Disp: 1 each, Rfl: 1   levocetirizine (XYZAL) 5 MG tablet, Take 1 tablet (5 mg total) by mouth daily., Disp: 90 tablet, Rfl: 3   montelukast (SINGULAIR) 10 MG tablet, Take 1 tablet (10 mg total) by mouth at bedtime., Disp: 90 tablet, Rfl: 3   Omega-3 Fatty Acids (FISH OIL) 1000 MG CAPS, Take by mouth., Disp: , Rfl:    predniSONE (DELTASONE) 20 MG tablet, 60 mg x2d, 40 mg x3d, 20 mg x2d, 10 mg x2d, Disp: 15  tablet, Rfl: 0   RYVENT 6 MG TABS, Take 6 mg by mouth 2 (two) times daily as needed., Disp: 60 tablet, Rfl: 5   triamcinolone ointment (KENALOG) 0.1 %, Apply 1 Application topically 2 (two) times daily as needed., Disp: 30 g, Rfl: 5   Objective:     Vitals:   09/02/22 0859  BP: 122/80  Pulse: 84  SpO2: 100%  Weight: 239 lb (108.4 kg)  Height: 5\' 10"  (1.778 m)      Body mass index is 34.29 kg/m.    Physical  Exam:    General: Well-appearing, cooperative, sitting comfortably in no acute distress.   OMT Physical Exam:  ASIS Compression Test: Positive Right Cervical: TTP paraspinal, C5 RRSL Rib: Bilateral elevated first rib with mild TTP Thoracic: TTP paraspinal,T4 RRSR  Lumbar: TTP paraspinal, L2 RLSL Pelvis: Right anterior innominate    Electronically signed by:  Benito Mccreedy D.[name] 9:29 AM 09/02/22

## 2022-08-30 ENCOUNTER — Ambulatory Visit: Payer: Self-pay

## 2022-08-30 ENCOUNTER — Telehealth: Payer: Self-pay | Admitting: Emergency Medicine

## 2022-08-30 NOTE — Telephone Encounter (Signed)
[facility] Pt states she went into [facility] last night. Per pt, "Follow-up - Need my potassium checked. Spoke with my on call cardiologist nurse last night and she wanted me to have this test done". Reason for visit reviewed w/ Dr Meda Coffee. Pt states she is no longer in [facility]. Pt was out of town last night. Pt directed to the ED or Urgent Care on [facility]. Pt verbalized an understanding.

## 2022-09-02 ENCOUNTER — Ambulatory Visit (INDEPENDENT_AMBULATORY_CARE_PROVIDER_SITE_OTHER): Payer: 59 | Admitting: Sports Medicine

## 2022-09-02 VITALS — BP 122/80 | HR 84 | Ht 70.0 in | Wt 239.0 lb

## 2022-09-02 DIAGNOSIS — M9902 Segmental and somatic dysfunction of thoracic region: Secondary | ICD-10-CM

## 2022-09-02 DIAGNOSIS — M9901 Segmental and somatic dysfunction of cervical region: Secondary | ICD-10-CM | POA: Diagnosis not present

## 2022-09-02 DIAGNOSIS — M9908 Segmental and somatic dysfunction of rib cage: Secondary | ICD-10-CM

## 2022-09-02 DIAGNOSIS — M9905 Segmental and somatic dysfunction of pelvic region: Secondary | ICD-10-CM

## 2022-09-02 DIAGNOSIS — M542 Cervicalgia: Secondary | ICD-10-CM | POA: Diagnosis not present

## 2022-09-02 DIAGNOSIS — R519 Headache, unspecified: Secondary | ICD-10-CM | POA: Diagnosis not present

## 2022-09-02 DIAGNOSIS — M9903 Segmental and somatic dysfunction of lumbar region: Secondary | ICD-10-CM

## 2022-09-02 NOTE — Patient Instructions (Signed)
Good to see you   

## 2022-09-15 ENCOUNTER — Other Ambulatory Visit: Payer: Self-pay | Admitting: Family Medicine

## 2022-09-29 NOTE — Progress Notes (Signed)
Caroline Gill [facility] [address] [place] [address] Phone: [phone]   Assessment and Plan:     There are no diagnoses linked to this encounter.  *** - Patient has received significant relief with OMT in the past.  Elects for repeat OMT today.  Tolerated well per note below. - Decision today to treat with OMT was based on Physical Exam   After verbal consent patient was treated with HVLA (high velocity low amplitude), ME (muscle energy), FPR (flex positional release), ST (soft tissue), PC/PD (Pelvic Compression/ Pelvic Decompression) techniques in cervical, rib, thoracic, lumbar, and pelvic areas. Patient tolerated the procedure well with improvement in symptoms.  Patient educated on potential side effects of soreness and recommended to rest, hydrate, and use Tylenol as needed for pain control.   Pertinent previous records reviewed include ***   Follow Up: ***     Subjective:   I, Caroline Gill, am serving as a Neurosurgeon for Caroline Gill   Chief Complaint: neck pain    HPI:    07/15/2022 Patient is a 47 year old female complaining of neck pain. Patient states that she had a chiro adjustment last week and has pain and dizziness since, went to the ED a couple of day ago,  she feels unsteady on her feet when she moves her head. She does not seem to notice if turning her head in any direction particular makes the unsteadiness worse. She is still having thoracic back discomfort.    08/05/2022 Patient states that she is better than it was , still has some dizziness, she was getting some catching when she does exercises , she feels like her atlas is out and she can hear heartbeat in her ear    09/02/2022 Patient states having an autoimmune flare, she states she is getting better gets injections tomorrow , tightness isnt as bad as it usually is   09/30/2022 Patient states     Relevant Historical Information: Weakly positive ANA,  Arnold-Chiari malformation    Additional pertinent review of systems negative.  Current Outpatient Medications  Medication Sig Dispense Refill   albuterol (VENTOLIN HFA) 108 (90 Base) MCG/ACT inhaler Inhale 2 puffs into the lungs every 6 (six) hours as needed for wheezing or shortness of breath. 18 g 3   cyclobenzaprine (FLEXERIL) 5 MG tablet Take 1 tablet (5 mg total) by mouth 2 (two) times daily as needed for muscle spasms. 60 tablet 0   EPINEPHrine (AUVI-Q) 0.3 mg/0.3 mL IJ SOAJ injection Inject 0.3 mg into the muscle as needed for anaphylaxis. 1 each 1   levocetirizine (XYZAL) 5 MG tablet Take 1 tablet (5 mg total) by mouth daily. 90 tablet 3   montelukast (SINGULAIR) 10 MG tablet Take 1 tablet (10 mg total) by mouth at bedtime. 90 tablet 3   Omega-3 Fatty Acids (FISH OIL) 1000 MG CAPS Take by mouth.     predniSONE (DELTASONE) 20 MG tablet 60 mg x2d, 40 mg x3d, 20 mg x2d, 10 mg x2d 15 tablet 0   RYVENT 6 MG TABS Take 6 mg by mouth 2 (two) times daily as needed. 60 tablet 5   triamcinolone ointment (KENALOG) 0.1 % Apply 1 Application topically 2 (two) times daily as needed. 30 g 5   No current facility-administered medications for this visit.      Objective:     There were no vitals filed for this visit.    There is no height or weight on  file to calculate BMI.    Physical Exam:     General: Well-appearing, cooperative, sitting comfortably in no acute distress.   OMT Physical Exam:  ASIS Compression Test: Positive Right Cervical: TTP paraspinal, *** Rib: Bilateral elevated first rib with TTP Thoracic: TTP paraspinal,*** Lumbar: TTP paraspinal,*** Pelvis: Right anterior innominate  Electronically signed by:  Caroline Gill [facility] 7:37 AM 09/29/22

## 2022-09-30 ENCOUNTER — Ambulatory Visit (INDEPENDENT_AMBULATORY_CARE_PROVIDER_SITE_OTHER): Payer: 59 | Admitting: Sports Medicine

## 2022-09-30 VITALS — BP 120/78 | HR 70 | Ht 70.0 in | Wt 238.0 lb

## 2022-09-30 DIAGNOSIS — M9901 Segmental and somatic dysfunction of cervical region: Secondary | ICD-10-CM

## 2022-09-30 DIAGNOSIS — M9902 Segmental and somatic dysfunction of thoracic region: Secondary | ICD-10-CM

## 2022-09-30 DIAGNOSIS — R519 Headache, unspecified: Secondary | ICD-10-CM

## 2022-09-30 DIAGNOSIS — M542 Cervicalgia: Secondary | ICD-10-CM

## 2022-09-30 DIAGNOSIS — M9903 Segmental and somatic dysfunction of lumbar region: Secondary | ICD-10-CM

## 2022-09-30 DIAGNOSIS — M9905 Segmental and somatic dysfunction of pelvic region: Secondary | ICD-10-CM

## 2022-09-30 DIAGNOSIS — M9908 Segmental and somatic dysfunction of rib cage: Secondary | ICD-10-CM

## 2022-09-30 NOTE — Patient Instructions (Signed)
Good to see you   

## 2022-10-27 NOTE — Progress Notes (Unsigned)
    Caroline Gill [name] [address] [place] [address] Phone: [phone]   Assessment and Plan:     There are no diagnoses linked to this encounter.  ***   Pertinent previous records reviewed include ***   Follow Up: ***     Subjective:   I, Vitaly Wanat, am serving as a Neurosurgeon for Doctor Richardean Sale   Chief Complaint: neck pain    HPI:    07/15/2022 Patient is a 47 year old female complaining of neck pain. Patient states that she had a chiro adjustment last week and has pain and dizziness since, went to the ED a couple of day ago,  she feels unsteady on her feet when she moves her head. She does not seem to notice if turning her head in any direction particular makes the unsteadiness worse. She is still having thoracic back discomfort.    08/05/2022 Patient states that she is better than it was , still has some dizziness, she was getting some catching when she does exercises , she feels like her atlas is out and she can hear heartbeat in her ear    09/02/2022 Patient states having an autoimmune flare, she states she is getting better gets injections tomorrow , tightness isnt as bad as it usually is    09/30/2022 Patient states that she Is pretty good , just here for an adjustment ,she has a bunion and would like any recommendations    10/28/2022 Patient states   Relevant Historical Information: Weakly positive ANA, Arnold-Chiari malformation  Additional pertinent review of systems negative.   Current Outpatient Medications:    albuterol (VENTOLIN HFA) 108 (90 Base) MCG/ACT inhaler, Inhale 2 puffs into the lungs every 6 (six) hours as needed for wheezing or shortness of breath., Disp: 18 g, Rfl: 3   cyclobenzaprine (FLEXERIL) 5 MG tablet, Take 1 tablet (5 mg total) by mouth 2 (two) times daily as needed for muscle spasms., Disp: 60 tablet, Rfl: 0   EPINEPHrine (AUVI-Q) 0.3 mg/0.3 mL IJ SOAJ injection, Inject 0.3 mg  into the muscle as needed for anaphylaxis., Disp: 1 each, Rfl: 1   levocetirizine (XYZAL) 5 MG tablet, Take 1 tablet (5 mg total) by mouth daily., Disp: 90 tablet, Rfl: 3   montelukast (SINGULAIR) 10 MG tablet, Take 1 tablet (10 mg total) by mouth at bedtime., Disp: 90 tablet, Rfl: 3   Omega-3 Fatty Acids (FISH OIL) 1000 MG CAPS, Take by mouth., Disp: , Rfl:    predniSONE (DELTASONE) 20 MG tablet, 60 mg x2d, 40 mg x3d, 20 mg x2d, 10 mg x2d, Disp: 15 tablet, Rfl: 0   RYVENT 6 MG TABS, Take 6 mg by mouth 2 (two) times daily as needed., Disp: 60 tablet, Rfl: 5   triamcinolone ointment (KENALOG) 0.1 %, Apply 1 Application topically 2 (two) times daily as needed., Disp: 30 g, Rfl: 5   Objective:     There were no vitals filed for this visit.    There is no height or weight on file to calculate BMI.    Physical Exam:    ***   Electronically signed by:  Caroline Gill [name] 7:17 AM 10/27/22

## 2022-10-28 ENCOUNTER — Ambulatory Visit (INDEPENDENT_AMBULATORY_CARE_PROVIDER_SITE_OTHER): Payer: 59 | Admitting: Sports Medicine

## 2022-10-28 VITALS — BP 122/80 | HR 89 | Ht 70.0 in | Wt 246.0 lb

## 2022-10-28 DIAGNOSIS — M542 Cervicalgia: Secondary | ICD-10-CM | POA: Diagnosis not present

## 2022-10-28 DIAGNOSIS — M9905 Segmental and somatic dysfunction of pelvic region: Secondary | ICD-10-CM

## 2022-10-28 DIAGNOSIS — R519 Headache, unspecified: Secondary | ICD-10-CM | POA: Diagnosis not present

## 2022-10-28 DIAGNOSIS — M9903 Segmental and somatic dysfunction of lumbar region: Secondary | ICD-10-CM

## 2022-10-28 DIAGNOSIS — M9902 Segmental and somatic dysfunction of thoracic region: Secondary | ICD-10-CM

## 2022-10-28 DIAGNOSIS — M9901 Segmental and somatic dysfunction of cervical region: Secondary | ICD-10-CM | POA: Diagnosis not present

## 2022-10-28 DIAGNOSIS — M9908 Segmental and somatic dysfunction of rib cage: Secondary | ICD-10-CM

## 2022-11-03 ENCOUNTER — Ambulatory Visit: Payer: 59 | Admitting: Sports Medicine

## 2022-11-17 ENCOUNTER — Encounter: Payer: Self-pay | Admitting: Family Medicine

## 2022-11-17 ENCOUNTER — Ambulatory Visit: Payer: 59 | Admitting: Family Medicine

## 2022-11-17 VITALS — BP 123/78 | HR 82 | Temp 98.0°F | Wt 246.0 lb

## 2022-11-17 DIAGNOSIS — L710 Perioral dermatitis: Secondary | ICD-10-CM | POA: Diagnosis not present

## 2022-11-17 MED ORDER — TRIAMCINOLONE ACETONIDE 0.5 % EX OINT: 1.0000 | TOPICAL_OINTMENT | Freq: Two times a day (BID) | CUTANEOUS | 5 refills | Status: AC | PRN

## 2022-11-17 NOTE — Progress Notes (Signed)
Caroline Gill , [birth year], 47 y.o., female MRN: [id number] Patient Care Team    Relationship Specialty Notifications Start End  Natalia Leatherwood, DO PCP - General Family Medicine  01/07/22   Aletha Halim, MD Consulting Physician Neurology  01/07/22   Ronda Fairly. Nicanor Bake, MD Consulting Physician Cardiology  01/07/22   Verlee Monte, MD Consulting Physician Allergy and Immunology  01/07/22   Edwyna Shell, MD Referring Physician Hematology and Oncology  01/07/22   Rolland Bimler T  Gastroenterology  01/07/22    Comment: Concepcion Living, MD Referring Physician Pulmonary Disease  01/07/22     Chief Complaint  Patient presents with   Rash    Nose; eyes; cheeks, ongoing for 6 months but moved to eyes over the last week     Subjective: Caroline Gill is a 47 y.o. Pt presents for an OV with complaints of rash of her face of 6 months duration.  Associated symptoms include fine rash with mild erythema, raised over chin, nasolabial folds and under eyes bilaterally.  Pt has tried over-the-counter creams to ease their symptoms.      04/06/2022    8:45 AM 01/07/2022   10:02 AM  Depression screen PHQ 2/9  Decreased Interest 0 3  Down, Depressed, Hopeless 0 1  PHQ - 2 Score 0 4  Altered sleeping 1   Tired, decreased energy 1   Change in appetite 0   Feeling bad or failure about yourself  1   Trouble concentrating 1   Moving slowly or fidgety/restless 0   Suicidal thoughts 0   PHQ-9 Score 4     Allergies  Allergen Reactions   Oxycodone Other (See Comments)    Severe headache Severe headache Severe headache   Tapentadol Rash   Social History   Social History Narrative   Marital status/children/pets: Married.  3 children.   Education/employment: College-educated.  Owner/CFO-employed   Safety:      -Wears a bicycle helmet riding a bike: Yes     -smoke alarm in the home:Yes     - wears seatbelt: Yes     - Feels safe in their relationships: Yes      Past Medical  History:  Diagnosis Date   Arthritis    Asthma    Atrial fibrillation (HCC)    Chicken pox    Depression    Migraine    SVT (supraventricular tachycardia)    Past Surgical History:  Procedure Laterality Date   BREAST BIOPSY  2020   CHOLECYSTECTOMY  2013   ENDOMETRIAL ABLATION  2013   KNEE ARTHROSCOPY Right 2018   meniscus   KNEE SURGERY Left 1992   "dislox"   SHOULDER ARTHROSCOPY  2018   Labral tear with anchors x3   VAGINAL DELIVERY     X3, 1996, 1999, 2002   Family History  Problem Relation Age of Onset   Skin cancer Mother    Asthma Mother    Osteoarthritis Mother    Thyroid disease Mother    Diabetes Mother    Stroke Mother    Atrial fibrillation Mother    Alcohol abuse Mother    Depression Mother    Hypertension Father    Osteoarthritis Father    Diabetes Father    Atrial fibrillation Father    Stroke Father    Hypertension Brother    Osteoarthritis Brother    Alcohol abuse Brother    Asthma Brother    Drug abuse Brother  Skin cancer Brother    Hypertension Maternal Grandmother    Diabetes Maternal Grandmother    Depression Maternal Grandmother    Osteoarthritis Maternal Grandmother    Miscarriages / Stillbirths Maternal Grandmother    Bone cancer Maternal Grandfather        late 81s   Dementia Maternal Grandfather    Rheum arthritis Paternal Grandmother    Depression Paternal Grandmother    Migraines Paternal Grandmother    Heart attack Paternal Grandfather 103   Depression Daughter    Anxiety disorder Daughter    ADD / ADHD Daughter    ADD / ADHD Son    Hypothyroidism Son    ADD / ADHD Son    Angioedema Neg Hx    Allergic rhinitis Neg Hx    Immunodeficiency Neg Hx    Urticaria Neg Hx    Eczema Neg Hx    Allergies as of 11/17/2022       Reactions   Oxycodone Other (See Comments)   Severe headache Severe headache Severe headache   Tapentadol Rash        Medication List        Accurate as of November 17, 2022  1:24 PM. If you have  any questions, ask your nurse or doctor.          STOP taking these medications    predniSONE 20 MG tablet Commonly known as: DELTASONE Stopped by: Felix Pacini, DO   RyVent 6 MG Tabs Generic drug: Carbinoxamine Maleate Stopped by: Felix Pacini, DO   triamcinolone ointment 0.1 % Commonly known as: KENALOG Replaced by: triamcinolone ointment 0.5 % Stopped by: Felix Pacini, DO       TAKE these medications    albuterol 108 (90 Base) MCG/ACT inhaler Commonly known as: VENTOLIN HFA Inhale 2 puffs into the lungs every 6 (six) hours as needed for wheezing or shortness of breath.   cyclobenzaprine 5 MG tablet Commonly known as: FLEXERIL Take 1 tablet (5 mg total) by mouth 2 (two) times daily as needed for muscle spasms.   EPINEPHrine 0.3 mg/0.3 mL Soaj injection Commonly known as: Auvi-Q Inject 0.3 mg into the muscle as needed for anaphylaxis.   Fish Oil 1000 MG Caps Take by mouth.   Klor-Con 20 MEQ packet Generic drug: potassium chloride Take 20 mEq by mouth daily.   levocetirizine 5 MG tablet Commonly known as: XYZAL Take 1 tablet (5 mg total) by mouth daily.   MAGNESIUM GLYCINATE PO Take by mouth.   montelukast 10 MG tablet Commonly known as: SINGULAIR Take 1 tablet (10 mg total) by mouth at bedtime.   triamcinolone ointment 0.5 % Commonly known as: KENALOG Apply 1 Application topically 2 (two) times daily as needed. Replaces: triamcinolone ointment 0.1 % Started by: Felix Pacini, DO   triamterene-hydrochlorothiazide 37.5-25 MG tablet Commonly known as: MAXZIDE-25 Take 1-2 tablets by mouth daily.        All past medical history, surgical history, allergies, family history, immunizations andmedications were updated in the EMR today and reviewed under the history and medication portions of their EMR.     ROS Negative, with the exception of above mentioned in HPI   Objective:  BP 123/78   Pulse 82   Temp 98 F (36.7 C)   Wt 246 lb (111.6 kg)    SpO2 95%   BMI 35.30 kg/m  Body mass index is 35.3 kg/m. Physical Exam Vitals and nursing note reviewed.  Constitutional:      General: She is not  in acute distress.    Appearance: Normal appearance. She is normal weight. She is not ill-appearing or toxic-appearing.  HENT:     Head: Normocephalic and atraumatic.  Eyes:     General: No scleral icterus.       Right eye: No discharge.        Left eye: No discharge.     Extraocular Movements: Extraocular movements intact.     Conjunctiva/sclera: Conjunctivae normal.     Pupils: Pupils are equal, round, and reactive to light.  Skin:    Findings: Rash (Fine raised rash.  Mild erythema.  Rash located chin and nasolabial folds and under bilateral eyes.  No drainage.  No fluctuance.) present.  Neurological:     Mental Status: She is alert and oriented to person, place, and time. Mental status is at baseline.     Motor: No weakness.     Coordination: Coordination normal.     Gait: Gait normal.  Psychiatric:        Mood and Affect: Mood normal.        Behavior: Behavior normal.        Thought Content: Thought content normal.        Judgment: Judgment normal.      No results found. No results found. No results found for this or any previous visit (from the past 24 hour(s)).  Assessment/Plan: Mileidy Grandinetti is a 47 y.o. female present for OV for  Perioral dermatitis Since rash is consistent with perioral dermatitis.  We discussed different potential causes including cosmetics, whitening toothpaste etc.  She did change her toothpaste around the last 6-9 months. AVS education on perioral dermatitis was provided. Kenalog cream prescribed twice daily Follow-up as needed  Reviewed expectations re: course of current medical issues. Discussed self-management of symptoms. Outlined signs and symptoms indicating need for more acute intervention. Patient verbalized understanding and all questions were answered. Patient received an  After-Visit Summary.    No orders of the defined types were placed in this encounter.  Meds ordered this encounter  Medications   triamcinolone ointment (KENALOG) 0.5 %    Sig: Apply 1 Application topically 2 (two) times daily as needed.    Dispense:  30 g    Refill:  5   Referral Orders  No referral(s) requested today     Note is dictated utilizing voice recognition software. Although note has been proof read prior to signing, occasional typographical errors still can be missed. If any questions arise, please do not hesitate to call for verification.   electronically signed by:  Felix Pacini, DO  [facility] [place]

## 2022-11-17 NOTE — Patient Instructions (Signed)
Perioral dermatitis ° °Perioral dermatitis is an eruption which is usually located around the mouth and nose.  It can be a rash and/or red bumps.  It occasionally occurs around the eyes.  It may be itchy and may burn.  The exact cause is unknown.  Some types of makeup, moisturizers, dental products, and prescription creams may be partially responsible for the eruption.  Topical steroids such as cortisone creams can temporarily make the rash better but with discontinuation the rash tends to recur and worsen.  If you have been using topical steroids, your dermatologist may need to gradually taper the strength of steroids.  Topical antibiotics, elidel cream, protopic ointment, and oral antiobiotics may be prescribed to treat this condition.  Although perioral dermatitis is not an infection, some antibiotics have anti-inflammatory properties that help it greatly. ° °

## 2022-11-24 NOTE — Progress Notes (Unsigned)
    [facility] [address] [place] [address] Phone: [phone]   Assessment and Plan:     There are no diagnoses linked to this encounter.  ***   Pertinent previous records reviewed include ***   Follow Up: ***     Subjective:   I, Caroline Gill, am serving as a Neurosurgeon for Caroline Gill   Chief Complaint: neck pain    HPI:    07/15/2022 Patient is a 47 year old female complaining of neck pain. Patient states that she had a chiro adjustment last week and has pain and dizziness since, went to the ED a couple of day ago,  she feels unsteady on her feet when she moves her head. She does not seem to notice if turning her head in any direction particular makes the unsteadiness worse. She is still having thoracic back discomfort.    08/05/2022 Patient states that she is better than it was , still has some dizziness, she was getting some catching when she does exercises , she feels like her atlas is out and she can hear heartbeat in her ear    09/02/2022 Patient states having an autoimmune flare, she states she is getting better gets injections tomorrow , tightness isnt as bad as it usually is    09/30/2022 Patient states that she Is pretty good , just here for an adjustment ,she has a bunion and would like any recommendations    10/28/2022 Patient states that she is hurting spent 14 hours sitting in the hospital  waiting area   11/25/2022 Patient states   Relevant Historical Information: Weakly positive ANA, Arnold-Chiari malformation  Additional pertinent review of systems negative.   Current Outpatient Medications:    albuterol (VENTOLIN HFA) 108 (90 Base) MCG/ACT inhaler, Inhale 2 puffs into the lungs every 6 (six) hours as needed for wheezing or shortness of breath., Disp: 18 g, Rfl: 3   cyclobenzaprine (FLEXERIL) 5 MG tablet, Take 1 tablet (5 mg total) by mouth 2 (two) times daily as needed for muscle  spasms., Disp: 60 tablet, Rfl: 0   EPINEPHrine (AUVI-Q) 0.3 mg/0.3 mL IJ SOAJ injection, Inject 0.3 mg into the muscle as needed for anaphylaxis., Disp: 1 each, Rfl: 1   KLOR-CON 20 MEQ packet, Take 20 mEq by mouth daily., Disp: , Rfl:    levocetirizine (XYZAL) 5 MG tablet, Take 1 tablet (5 mg total) by mouth daily., Disp: 90 tablet, Rfl: 3   MAGNESIUM GLYCINATE PO, Take by mouth., Disp: , Rfl:    montelukast (SINGULAIR) 10 MG tablet, Take 1 tablet (10 mg total) by mouth at bedtime., Disp: 90 tablet, Rfl: 3   Omega-3 Fatty Acids (FISH OIL) 1000 MG CAPS, Take by mouth. (Patient not taking: Reported on 11/17/2022), Disp: , Rfl:    triamcinolone ointment (KENALOG) 0.5 %, Apply 1 Application topically 2 (two) times daily as needed., Disp: 30 g, Rfl: 5   triamterene-hydrochlorothiazide (MAXZIDE-25) 37.5-25 MG tablet, Take 1-2 tablets by mouth daily., Disp: , Rfl:    Objective:     There were no vitals filed for this visit.    There is no height or weight on file to calculate BMI.    Physical Exam:    ***   Electronically signed by:  [facility] 7:10 AM 11/24/22

## 2022-11-25 ENCOUNTER — Ambulatory Visit (INDEPENDENT_AMBULATORY_CARE_PROVIDER_SITE_OTHER): Payer: 59 | Admitting: Sports Medicine

## 2022-11-25 ENCOUNTER — Ambulatory Visit: Payer: 59 | Admitting: Sports Medicine

## 2022-11-25 VITALS — BP 128/78 | HR 101 | Ht 70.0 in | Wt 246.0 lb

## 2022-11-25 DIAGNOSIS — M9908 Segmental and somatic dysfunction of rib cage: Secondary | ICD-10-CM

## 2022-11-25 DIAGNOSIS — M9902 Segmental and somatic dysfunction of thoracic region: Secondary | ICD-10-CM | POA: Diagnosis not present

## 2022-11-25 DIAGNOSIS — M9901 Segmental and somatic dysfunction of cervical region: Secondary | ICD-10-CM | POA: Diagnosis not present

## 2022-11-25 DIAGNOSIS — M9905 Segmental and somatic dysfunction of pelvic region: Secondary | ICD-10-CM

## 2022-11-25 DIAGNOSIS — M542 Cervicalgia: Secondary | ICD-10-CM | POA: Diagnosis not present

## 2022-11-25 DIAGNOSIS — M9903 Segmental and somatic dysfunction of lumbar region: Secondary | ICD-10-CM

## 2022-11-25 DIAGNOSIS — G8929 Other chronic pain: Secondary | ICD-10-CM

## 2022-11-25 DIAGNOSIS — M546 Pain in thoracic spine: Secondary | ICD-10-CM | POA: Diagnosis not present

## 2022-11-26 ENCOUNTER — Encounter: Payer: Self-pay | Admitting: Allergy & Immunology

## 2022-11-26 ENCOUNTER — Other Ambulatory Visit: Payer: Self-pay

## 2022-11-26 ENCOUNTER — Ambulatory Visit (INDEPENDENT_AMBULATORY_CARE_PROVIDER_SITE_OTHER): Payer: 59 | Admitting: Allergy & Immunology

## 2022-11-26 VITALS — BP 138/82 | HR 99 | Temp 98.5°F | Resp 16 | Ht 70.5 in | Wt 249.1 lb

## 2022-11-26 DIAGNOSIS — R062 Wheezing: Secondary | ICD-10-CM | POA: Diagnosis not present

## 2022-11-26 DIAGNOSIS — T781XXD Other adverse food reactions, not elsewhere classified, subsequent encounter: Secondary | ICD-10-CM | POA: Diagnosis not present

## 2022-11-26 DIAGNOSIS — R7982 Elevated C-reactive protein (CRP): Secondary | ICD-10-CM

## 2022-11-26 DIAGNOSIS — J302 Other seasonal allergic rhinitis: Secondary | ICD-10-CM

## 2022-11-26 DIAGNOSIS — L239 Allergic contact dermatitis, unspecified cause: Secondary | ICD-10-CM

## 2022-11-26 DIAGNOSIS — J3089 Other allergic rhinitis: Secondary | ICD-10-CM

## 2022-11-26 DIAGNOSIS — R899 Unspecified abnormal finding in specimens from other organs, systems and tissues: Secondary | ICD-10-CM

## 2022-11-26 MED ORDER — FAMOTIDINE 40 MG PO TABS: 40.0000 mg | ORAL_TABLET | Freq: Every day | ORAL | 1 refills | Status: DC

## 2022-11-26 NOTE — Patient Instructions (Addendum)
Adverse food reaction - stable on carnivore diet - Continue with your same diet.   - We are going to get another CRP to trend it.   2. Seasonal and perennial allergic rhinitis (grasses, ragweed, weeds, trees and dog) - Continue with: Xyzal (levocetirizine) 5mg  tablet 1-2 times daily and Singulair (montelukast) 10mg  daily   3. Possible contact dermatitis (fragrance mix, paraben mix, gold, formaldehyde, Stress away essential oil) - Avoid triggers as tolerated.   4. Mild persistent asthma, uncomplicated  - Lung testing looks great today.  - Try to take the Symbicort at least at night (two puffs).  - I would add on PEPCID 40mg  at night since you are reporting that this is worse when you lay on your right side (which is the position where it is easier for the gastric acid gets into the esophagus).  - This one is not associated with the bone demineralization issues of the PPI.  - Monitor your symptoms at night.   5. Return in about 6 months (around 05/28/2023).    Please inform us of any Emergency Department visits, hospitalizations, or changes in symptoms. Call us before going to the ED for breathing or allergy symptoms since we might be able to fit you in for a sick visit. Feel free to contact us anytime with any questions, problems, or concerns.  It was a pleasure to see you again today!  Websites that have reliable patient information: 1. American Academy of Asthma, Allergy, and Immunology: www.aaaai.org 2. Food Allergy Research and Education (FARE): foodallergy.org 3. Mothers of Asthmatics: http://www.asthmacommunitynetwork.org 4. American College of Allergy, Asthma, and Immunology: www.acaai.org   COVID-19 Vaccine Information can be found at: [email or web address] For questions related to vaccine distribution or appointments, please email [email or web address] or call [phone].   We realize that you might be concerned about  having an allergic reaction to the COVID19 vaccines. To help with that concern, WE ARE OFFERING THE COVID19 VACCINES IN OUR OFFICE! Ask the front desk for dates!     "Like" [place] on Facebook and Instagram for our latest updates!      A healthy democracy works best when [employer] participate! Make sure you are registered to vote! If you have moved or changed any of your contact information, you will need to get this updated before voting!  In some cases, you MAY be able to register to vote online: [email or web address]

## 2022-11-26 NOTE — Progress Notes (Addendum)
FOLLOW UP  Date of Service/Encounter:  11/26/22   Assessment:   Allergic reaction - unclear trigger  Mild persistent asthma, uncomplicated - intermittently compliant with the regimen   Seasonal and perennial allergic rhinitis (grasses, weeds, ragweed, trees, dog) - was on allergen immunotherapy in the past with multiple large local reactions   Possible contact dermatitis (fragrance mix, paraben mix, gold, formaldehyde, Stress away essential oil)   Elevated CRP (followed by Dr. Sheliah Hatch with a negative workup)   Neutrophilia - followed by Hematology/Oncology (Dr. Neil Crouch)   Supraventricular tachycardia  Plan/Recommendations:   Adverse food reaction - stable on carnivore diet - Continue with your same diet.   - We are going to get another CRP to trend it.   2. Seasonal and perennial allergic rhinitis (grasses, ragweed, weeds, trees and dog) - Continue with: Xyzal (levocetirizine) 5mg  tablet 1-2 times daily and Singulair (montelukast) 10mg  daily   3. Possible contact dermatitis (fragrance mix, paraben mix, gold, formaldehyde, Stress away essential oil) - Avoid triggers as tolerated.   4. Mild persistent asthma, uncomplicated  - Lung testing looks great today.  - Try to take the Symbicort at least at night (two puffs).  - I would add on PEPCID 40mg  at night since you are reporting that this is worse when you lay on your right side (which is the position where it is easier for the gastric acid gets into the esophagus).  - This one is not associated with the bone demineralization issues of the PPI.  - Monitor your symptoms at night.   5. Return in about 6 months (around 05/28/2023).    Subjective:   Caroline Gill is a 47 y.o. female presenting today for follow up of  Chief Complaint  Patient presents with   Follow-up    Has noticed more wheezing at night. Has noticed it more on the right side of her chest.     Caroline Gill has a history of the  following: Patient Active Problem List   Diagnosis Date Noted   Positive ANA (antinuclear antibody) 03/18/2022   CRP elevated 03/18/2022   Polyarthralgia 01/23/2022   Elevated WBC count 01/07/2022   Encounter for long-term current use of medication 01/07/2022   Leg cramps 01/07/2022   Allergic reaction 03/04/2020   Extrinsic asthma 03/04/2020   Seasonal and perennial allergic rhinitis 03/04/2020   Allergic contact dermatitis 03/04/2020   Family history of hypercoagulability 03/03/2013    History obtained from: chart review and patient.  Caroline Gill is a 47 y.o. female presenting for a follow up visit.  She was last seen in December 2023.  At that time, she was doing well on her carnivore diet.  We checked her CRP which was still elevated at 14.  For her rhinitis, we continue with Xyzal 1 to 2 tablets daily and Singulair 10 mg daily.  For contact dermatitis, she avoided all of her triggers.  We did send in RyVent to see if that would help more than the he was cetirizine.  In the interim, she was seen in the emergency room in January 2024 with chest pain.  She was having headaches, dizziness, brain fog, difficulty doing routine tasks.  She had been given Flexeril and prednisone by her PCP without any improvement. This was triggered by her chiropractor adjusting her too well and this caused all of these symptoms. She now follows with Dr. Richardean Sale. Neck pain is fairly good. She now sees him every 4 weeks.   She has been  taking care of her mother following knee replacement surgery. She is alternating with her brother in taking care of her mother.   She does have a slight Chiari malformation which was known already. She does not have IHH, but she is on traimterene. She is not super compliant with this because it makes her dehydrated, but she will start taking it when she starts hearing ringing in her ears.   Asthma/Respiratory Symptom History: She has been wheezing more than normal.  She  feels that this is most notable at night. She does not take her inhalers before bed. She has albuterol to use. She has Symbicort, but she will go weeks without it. She uses her albuterol once per week over the last month.  She is having mostly nocturnal symptoms. She reports that this is worse when she lays on the right side compared to the left. She does not think that she has GERD.  Allergic Rhinitis Symptom History: She is doing her levocetirizine on a routine basis, typically once daily. She is supposed to do it twice daily, but this often does not happen. She is doing well with this regimen. She has not been on antibiotics at all for her symptoms.   Otherwise, there have been no changes to her past medical history, surgical history, family history, or social history.    Review of Systems  Constitutional: Negative.  Negative for chills, fever, malaise/fatigue and weight loss.  HENT:  Positive for congestion. Negative for ear discharge and ear pain.   Eyes:  Negative for pain, discharge and redness.  Respiratory:  Negative for cough, sputum production, shortness of breath and wheezing.   Cardiovascular: Negative.  Negative for chest pain and palpitations.  Gastrointestinal:  Negative for abdominal pain, constipation, diarrhea, heartburn, nausea and vomiting.  Skin:  Negative for itching and rash.  Neurological:  Negative for dizziness and headaches.  Endo/Heme/Allergies:  Positive for environmental allergies. Does not bruise/bleed easily.       Objective:   Blood pressure 138/82, pulse 99, temperature 98.5 F (36.9 C), resp. rate 16, height 5' 10.5" (1.791 m), weight 249 lb 2 oz (113 kg), SpO2 97 %. Body mass index is 35.24 kg/m.    Physical Exam Vitals reviewed.  Constitutional:      Appearance: She is well-developed.     Comments: She appears to have lost some weight.   HENT:     Head: Normocephalic and atraumatic.     Right Ear: Tympanic membrane, ear canal and external  ear normal.     Left Ear: Tympanic membrane, ear canal and external ear normal.     Nose: No nasal deformity, septal deviation, mucosal edema or rhinorrhea.     Right Turbinates: Enlarged, swollen and pale.     Left Turbinates: Enlarged, swollen and pale.     Right Sinus: No maxillary sinus tenderness or frontal sinus tenderness.     Left Sinus: No maxillary sinus tenderness or frontal sinus tenderness.     Comments: No nasal polyps noted.  Turbinates erythematous.  No epistaxis.    Mouth/Throat:     Mouth: Mucous membranes are not pale and not dry.     Pharynx: Uvula midline.  Eyes:     General: Lids are normal. No allergic shiner.       Right eye: No discharge.        Left eye: No discharge.     Conjunctiva/sclera: Conjunctivae normal.     Right eye: Right conjunctiva is not injected. No  chemosis.    Left eye: Left conjunctiva is not injected. No chemosis.    Pupils: Pupils are equal, round, and reactive to light.  Cardiovascular:     Rate and Rhythm: Normal rate and regular rhythm.     Heart sounds: Normal heart sounds.  Pulmonary:     Effort: Pulmonary effort is normal. No tachypnea, accessory muscle usage or respiratory distress.     Breath sounds: Normal breath sounds. No wheezing, rhonchi or rales.     Comments: Moving air well in all lung fields.  No increased work of breathing. Chest:     Chest wall: No tenderness.  Lymphadenopathy:     Cervical: No cervical adenopathy.  Skin:    General: Skin is warm.     Capillary Refill: Capillary refill takes less than 2 seconds.     Coloration: Skin is not pale.     Findings: No abrasion, erythema, petechiae or rash. Rash is not papular, urticarial or vesicular.     Comments: Faint erythema, but overall fairly clear skin.  She does have an both of her hands and arms.  Neurological:     Mental Status: She is alert.  Psychiatric:        Behavior: Behavior is cooperative.      Diagnostic studies:    Spirometry: results normal  (FEV1: 2.73/81%, FVC: 3.65/86%, FEV1/FVC: 75%).    Spirometry consistent with normal pattern.    Allergy Studies: resent CRP to trend this level        Malachi Bonds, MD  [facility]

## 2022-11-27 ENCOUNTER — Other Ambulatory Visit: Payer: Self-pay

## 2022-11-27 MED ORDER — LEVOCETIRIZINE DIHYDROCHLORIDE 5 MG PO TABS: 5.0000 mg | ORAL_TABLET | Freq: Every day | ORAL | 1 refills | Status: DC

## 2022-11-27 MED ORDER — MONTELUKAST SODIUM 10 MG PO TABS: 10.0000 mg | ORAL_TABLET | Freq: Every day | ORAL | 1 refills | Status: DC

## 2022-12-10 ENCOUNTER — Encounter: Payer: Self-pay | Admitting: Family Medicine

## 2022-12-10 LAB — C-REACTIVE PROTEIN: CRP: 44 mg/L — ABNORMAL HIGH (ref 0–10)

## 2022-12-10 NOTE — Telephone Encounter (Signed)
The CRP collected was by Dr. Dellis Anes. If patient has questions surrounding my thoughts on her CRP by Dr. Dellis Anes, I would recommend she make an appointment to discuss so I have all the information needed. Furthermore if she is asking for weight loss counseling please advise her: If you are interested in weight loss counseling please make appt to discuss and bring with you 2 weeks of a food diary/log on notebook paper.  Food log is mandatory on first appt to proceed with counseling.  Weight loss counseling encompasses diet, exercise and can include  medications when appropriate and affordable.  There are routine appts for check-ins and weights to track progress and keep you on track.  Routine check-ins (in person) are also mandatory to continue with prescription refills. Check-in timeline  can range from 4 weeks to 12 weeks, depending on physician's recommendations and which step you are in of your weight loss journey.  Please check with your insurance prior to appt and ask them if they cover weight loss medications for your BMI? And if so, which medications. They may tell you some of the diabetes meds that are used for weight loss also,  are on your formulary- but this does not mean they are covered for weight loss only.  Even if they tell with a prior auth it is covered- make sure they check to see if you personally meet criteria with your BMI.

## 2022-12-11 ENCOUNTER — Encounter: Payer: Self-pay | Admitting: Allergy & Immunology

## 2022-12-21 ENCOUNTER — Other Ambulatory Visit: Payer: Self-pay | Admitting: Family Medicine

## 2022-12-22 NOTE — Progress Notes (Signed)
[name] [address] [place] [address] Phone: [phone]   Assessment and Plan:     There are no diagnoses linked to this encounter.  ***   Pertinent previous records reviewed include ***   Follow Up: ***     Subjective:   I, Caroline Gill, am serving as a Neurosurgeon for Caroline Gill   Chief Complaint: neck pain    HPI:    07/15/2022 Patient is a 47 year old female complaining of neck pain. Patient states that she had a chiro adjustment last week and has pain and dizziness since, went to the ED a couple of day ago,  she feels unsteady on her feet when she moves her head. She does not seem to notice if turning her head in any direction particular makes the unsteadiness worse. She is still having thoracic back discomfort.    08/05/2022 Patient states that she is better than it was , still has some dizziness, she was getting some catching when she does exercises , she feels like her atlas is out and she can hear heartbeat in her ear    09/02/2022 Patient states having an autoimmune flare, she states she is getting better gets injections tomorrow , tightness isnt as bad as it usually is    09/30/2022 Patient states that she Is pretty good , just here for an adjustment ,she has a bunion and would like any recommendations    10/28/2022 Patient states that she is hurting spent 14 hours sitting in the hospital  waiting area   11/25/2022 Patient states she is tired and in some pain , has been sleeping on an air mattress   12/23/2022 Patient states    Relevant Historical Information: Weakly positive ANA, Arnold-Chiari malformation  Additional pertinent review of systems negative.   Current Outpatient Medications:    albuterol (VENTOLIN HFA) 108 (90 Base) MCG/ACT inhaler, Inhale 2 puffs into the lungs every 6 (six) hours as needed for wheezing or shortness of breath., Disp: 18 g, Rfl: 3   cyclobenzaprine  (FLEXERIL) 5 MG tablet, Take 1 tablet (5 mg total) by mouth 2 (two) times daily as needed for muscle spasms., Disp: 60 tablet, Rfl: 0   EPINEPHrine (AUVI-Q) 0.3 mg/0.3 mL IJ SOAJ injection, Inject 0.3 mg into the muscle as needed for anaphylaxis., Disp: 1 each, Rfl: 1   famotidine (PEPCID) 40 MG tablet, Take 1 tablet (40 mg total) by mouth daily., Disp: 90 tablet, Rfl: 1   KLOR-CON 20 MEQ packet, Take 20 mEq by mouth daily., Disp: , Rfl:    levocetirizine (XYZAL) 5 MG tablet, Take 1 tablet (5 mg total) by mouth daily., Disp: 90 tablet, Rfl: 1   MAGNESIUM GLYCINATE PO, Take by mouth., Disp: , Rfl:    montelukast (SINGULAIR) 10 MG tablet, Take 1 tablet (10 mg total) by mouth at bedtime., Disp: 90 tablet, Rfl: 1   Omega-3 Fatty Acids (FISH OIL) 1000 MG CAPS, Take by mouth. (Patient not taking: Reported on 11/17/2022), Disp: , Rfl:    triamcinolone ointment (KENALOG) 0.5 %, Apply 1 Application topically 2 (two) times daily as needed., Disp: 30 g, Rfl: 5   triamterene-hydrochlorothiazide (MAXZIDE-25) 37.5-25 MG tablet, Take 1-2 tablets by mouth daily., Disp: , Rfl:    Objective:     There were no vitals filed for this visit.    There is no height or weight on file to calculate BMI.    Physical Exam:    ***  Electronically signed by:  Caroline [name] 11:57 AM 12/22/22

## 2022-12-23 ENCOUNTER — Ambulatory Visit (INDEPENDENT_AMBULATORY_CARE_PROVIDER_SITE_OTHER): Payer: 59 | Admitting: Sports Medicine

## 2022-12-23 VITALS — BP 124/82 | HR 81 | Ht 70.0 in | Wt 242.0 lb

## 2022-12-23 DIAGNOSIS — M9901 Segmental and somatic dysfunction of cervical region: Secondary | ICD-10-CM | POA: Diagnosis not present

## 2022-12-23 DIAGNOSIS — M9903 Segmental and somatic dysfunction of lumbar region: Secondary | ICD-10-CM | POA: Diagnosis not present

## 2022-12-23 DIAGNOSIS — G8929 Other chronic pain: Secondary | ICD-10-CM | POA: Diagnosis not present

## 2022-12-23 DIAGNOSIS — M9905 Segmental and somatic dysfunction of pelvic region: Secondary | ICD-10-CM

## 2022-12-23 DIAGNOSIS — M9908 Segmental and somatic dysfunction of rib cage: Secondary | ICD-10-CM

## 2022-12-23 DIAGNOSIS — M9902 Segmental and somatic dysfunction of thoracic region: Secondary | ICD-10-CM

## 2022-12-23 DIAGNOSIS — M546 Pain in thoracic spine: Secondary | ICD-10-CM | POA: Diagnosis not present

## 2022-12-23 NOTE — Patient Instructions (Signed)
4 week follow up

## 2023-01-06 ENCOUNTER — Other Ambulatory Visit (HOSPITAL_COMMUNITY): Payer: Self-pay

## 2023-01-19 NOTE — Progress Notes (Unsigned)
[facility] [address] [place] [address] Phone: [phone]   Assessment and Plan:    1. Chronic bilateral thoracic back pain 2. Somatic dysfunction of cervical region 3. Somatic dysfunction of lumbar region 4. Somatic dysfunction of pelvic region 5. Somatic dysfunction of rib region  -Chronic with exacerbation, subsequent sports medicine visit - Recurrence of multiple musculoskeletal complaints with most prominent being in upper back after recent vacation and travel - Patient has received relief with OMT in the past.  Elects for repeat OMT today.  Tolerated well per note below. - Decision today to treat with OMT was based on Physical Exam   After verbal consent patient was treated with HVLA (high velocity low amplitude), ME (muscle energy), FPR (flex positional release), ST (soft tissue), PC/PD (Pelvic Compression/ Pelvic Decompression) techniques in cervical, rib, thoracic, lumbar, and pelvic areas. Patient tolerated the procedure well with improvement in symptoms.  Patient educated on potential side effects of soreness and recommended to rest, hydrate, and use Tylenol as needed for pain control.   Pertinent previous records reviewed include none   Follow Up: 4 weeks for reevaluation.  Could consider repeat OMT   Subjective:   I, Caroline Gill, am serving as a Neurosurgeon for Doctor Richardean Sale   Chief Complaint: neck pain    HPI:    07/15/2022 Patient is a 47 year old female complaining of neck pain. Patient states that she had a chiro adjustment last week and has pain and dizziness since, went to the ED a couple of day ago,  she feels unsteady on her feet when she moves her head. She does not seem to notice if turning her head in any direction particular makes the unsteadiness worse. She is still having thoracic back discomfort.    08/05/2022 Patient states that she is better than it was , still has some dizziness, she was getting  some catching when she does exercises , she feels like her atlas is out and she can hear heartbeat in her ear    09/02/2022 Patient states having an autoimmune flare, she states she is getting better gets injections tomorrow , tightness isnt as bad as it usually is    09/30/2022 Patient states that she Is pretty good , just here for an adjustment ,she has a bunion and would like any recommendations    10/28/2022 Patient states that she is hurting spent 14 hours sitting in the hospital  waiting area   11/25/2022 Patient states she is tired and in some pain , has been sleeping on an air mattress    12/23/2022 Patient states that she is feeling good, thoracic is flared she has been sitting at the computer   01/20/2023 Patient states that she is a little sore but overall pretty good      Relevant Historical Information: Weakly positive ANA, Arnold-Chiari malformation  Additional pertinent review of systems negative.  Current Outpatient Medications  Medication Sig Dispense Refill   albuterol (VENTOLIN HFA) 108 (90 Base) MCG/ACT inhaler Inhale 2 puffs into the lungs every 6 (six) hours as needed for wheezing or shortness of breath. 18 g 3   cyclobenzaprine (FLEXERIL) 5 MG tablet Take 1 tablet (5 mg total) by mouth 2 (two) times daily as needed for muscle spasms. 60 tablet 0   EPINEPHrine (AUVI-Q) 0.3 mg/0.3 mL IJ SOAJ injection Inject 0.3 mg into the muscle as needed for anaphylaxis. 1 each 1   famotidine (PEPCID) 40 MG tablet  Take 1 tablet (40 mg total) by mouth daily. 90 tablet 1   KLOR-CON 20 MEQ packet Take 20 mEq by mouth daily.     levocetirizine (XYZAL) 5 MG tablet Take 1 tablet (5 mg total) by mouth daily. 90 tablet 1   MAGNESIUM GLYCINATE PO Take by mouth.     montelukast (SINGULAIR) 10 MG tablet Take 1 tablet (10 mg total) by mouth at bedtime. 90 tablet 1   Omega-3 Fatty Acids (FISH OIL) 1000 MG CAPS Take by mouth.     triamcinolone ointment (KENALOG) 0.5 % Apply 1 Application  topically 2 (two) times daily as needed. 30 g 5   triamterene-hydrochlorothiazide (MAXZIDE-25) 37.5-25 MG tablet Take 1-2 tablets by mouth daily.     No current facility-administered medications for this visit.      Objective:     Vitals:   01/20/23 0816  Pulse: 92  SpO2: 97%  Weight: 250 lb (113.4 kg)  Height: 5\' 10"  (1.778 m)      Body mass index is 35.87 kg/m.    Physical Exam:     General: Well-appearing, cooperative, sitting comfortably in no acute distress.   OMT Physical Exam:  ASIS Compression Test: Positive Right Cervical: TTP paraspinal, C3 RLSR Rib: Bilateral elevated first rib with TTP Thoracic: TTP paraspinal, T2-4 RLSR, T5-7 RRSL Lumbar: TTP paraspinal, L2 RLSL Pelvis: Right anterior innominate  Electronically signed by:  Caroline Gill [facility] 8:30 AM 01/20/23

## 2023-01-20 ENCOUNTER — Ambulatory Visit (INDEPENDENT_AMBULATORY_CARE_PROVIDER_SITE_OTHER): Payer: 59 | Admitting: Sports Medicine

## 2023-01-20 VITALS — HR 92 | Ht 70.0 in | Wt 250.0 lb

## 2023-01-20 DIAGNOSIS — M9901 Segmental and somatic dysfunction of cervical region: Secondary | ICD-10-CM

## 2023-01-20 DIAGNOSIS — M9905 Segmental and somatic dysfunction of pelvic region: Secondary | ICD-10-CM

## 2023-01-20 DIAGNOSIS — M9908 Segmental and somatic dysfunction of rib cage: Secondary | ICD-10-CM

## 2023-01-20 DIAGNOSIS — M546 Pain in thoracic spine: Secondary | ICD-10-CM | POA: Diagnosis not present

## 2023-01-20 DIAGNOSIS — G8929 Other chronic pain: Secondary | ICD-10-CM | POA: Diagnosis not present

## 2023-01-20 DIAGNOSIS — M9903 Segmental and somatic dysfunction of lumbar region: Secondary | ICD-10-CM

## 2023-01-20 NOTE — Patient Instructions (Signed)
4 week follow up [facility]

## 2023-01-26 LAB — HM MAMMOGRAPHY

## 2023-02-16 NOTE — Progress Notes (Deleted)
[name] [address] [place] [address] Phone: [phone]   Assessment and Plan:     There are no diagnoses linked to this encounter.  *** - Patient has received relief with OMT in the past.  Elects for repeat OMT today.  Tolerated well per note below. - Decision today to treat with OMT was based on Physical Exam   After verbal consent patient was treated with HVLA (high velocity low amplitude), ME (muscle energy), FPR (flex positional release), ST (soft tissue), PC/PD (Pelvic Compression/ Pelvic Decompression) techniques in cervical, rib, thoracic, lumbar, and pelvic areas. Patient tolerated the procedure well with improvement in symptoms.  Patient educated on potential side effects of soreness and recommended to rest, hydrate, and use Tylenol as needed for pain control.   Pertinent previous records reviewed include ***   Follow Up: ***     Subjective:   I, Caroline Gill, am serving as a Neurosurgeon for Doctor Richardean Sale   Chief Complaint: neck pain    HPI:    07/15/2022 Patient is a 47 year old female complaining of neck pain. Patient states that she had a chiro adjustment last week and has pain and dizziness since, went to the ED a couple of day ago,  she feels unsteady on her feet when she moves her head. She does not seem to notice if turning her head in any direction particular makes the unsteadiness worse. She is still having thoracic back discomfort.    08/05/2022 Patient states that she is better than it was , still has some dizziness, she was getting some catching when she does exercises , she feels like her atlas is out and she can hear heartbeat in her ear    09/02/2022 Patient states having an autoimmune flare, she states she is getting better gets injections tomorrow , tightness isnt as bad as it usually is    09/30/2022 Patient states that she Is pretty good , just here for an adjustment ,she has a bunion and would  like any recommendations    10/28/2022 Patient states that she is hurting spent 14 hours sitting in the hospital  waiting area   11/25/2022 Patient states she is tired and in some pain , has been sleeping on an air mattress    12/23/2022 Patient states that she is feeling good, thoracic is flared she has been sitting at the computer    01/20/2023 Patient states that she is a little sore but overall pretty good    02/17/2023 Patient states   Relevant Historical Information: Weakly positive ANA, Arnold-Chiari malformation  Additional pertinent review of systems negative.  Current Outpatient Medications  Medication Sig Dispense Refill   albuterol (VENTOLIN HFA) 108 (90 Base) MCG/ACT inhaler Inhale 2 puffs into the lungs every 6 (six) hours as needed for wheezing or shortness of breath. 18 g 3   cyclobenzaprine (FLEXERIL) 5 MG tablet Take 1 tablet (5 mg total) by mouth 2 (two) times daily as needed for muscle spasms. 60 tablet 0   EPINEPHrine (AUVI-Q) 0.3 mg/0.3 mL IJ SOAJ injection Inject 0.3 mg into the muscle as needed for anaphylaxis. 1 each 1   famotidine (PEPCID) 40 MG tablet Take 1 tablet (40 mg total) by mouth daily. 90 tablet 1   KLOR-CON 20 MEQ packet Take 20 mEq by mouth daily.     levocetirizine (XYZAL) 5 MG tablet Take 1 tablet (5 mg total) by mouth daily. 90 tablet 1  MAGNESIUM GLYCINATE PO Take by mouth.     montelukast (SINGULAIR) 10 MG tablet Take 1 tablet (10 mg total) by mouth at bedtime. 90 tablet 1   Omega-3 Fatty Acids (FISH OIL) 1000 MG CAPS Take by mouth.     triamcinolone ointment (KENALOG) 0.5 % Apply 1 Application topically 2 (two) times daily as needed. 30 g 5   triamterene-hydrochlorothiazide (MAXZIDE-25) 37.5-25 MG tablet Take 1-2 tablets by mouth daily.     No current facility-administered medications for this visit.      Objective:     There were no vitals filed for this visit.    There is no height or weight on file to calculate BMI.    Physical  Exam:     General: Well-appearing, cooperative, sitting comfortably in no acute distress.   OMT Physical Exam:  ASIS Compression Test: Positive Right Cervical: TTP paraspinal, *** Rib: Bilateral elevated first rib with TTP Thoracic: TTP paraspinal,*** Lumbar: TTP paraspinal,*** Pelvis: Right anterior innominate  Electronically signed by:  Aleen [name] 7:10 AM 02/16/23

## 2023-02-17 ENCOUNTER — Ambulatory Visit: Payer: 59 | Admitting: Sports Medicine

## 2023-02-22 ENCOUNTER — Other Ambulatory Visit: Payer: Self-pay | Admitting: Family Medicine

## 2023-02-22 NOTE — Progress Notes (Unsigned)
[name] [address] [place] [address] Phone: [phone]   Assessment and Plan:     There are no diagnoses linked to this encounter.  ***   Pertinent previous records reviewed include ***   Follow Up: ***     Subjective:   I, Caroline Gill, am serving as a Neurosurgeon for Doctor Richardean Sale   Chief Complaint: neck pain    HPI:    07/15/2022 Patient is a 47 year old female complaining of neck pain. Patient states that she had a chiro adjustment last week and has pain and dizziness since, went to the ED a couple of day ago,  she feels unsteady on her feet when she moves her head. She does not seem to notice if turning her head in any direction particular makes the unsteadiness worse. She is still having thoracic back discomfort.    08/05/2022 Patient states that she is better than it was , still has some dizziness, she was getting some catching when she does exercises , she feels like her atlas is out and she can hear heartbeat in her ear    09/02/2022 Patient states having an autoimmune flare, she states she is getting better gets injections tomorrow , tightness isnt as bad as it usually is    09/30/2022 Patient states that she Is pretty good , just here for an adjustment ,she has a bunion and would like any recommendations    10/28/2022 Patient states that she is hurting spent 14 hours sitting in the hospital  waiting area   11/25/2022 Patient states she is tired and in some pain , has been sleeping on an air mattress    12/23/2022 Patient states that she is feeling good, thoracic is flared she has been sitting at the computer    01/20/2023 Patient states that she is a little sore but overall pretty good    02/23/2023 Patient states   Relevant Historical Information: Weakly positive ANA, Arnold-Chiari malformation  Additional pertinent review of systems negative.   Current Outpatient Medications:     albuterol (VENTOLIN HFA) 108 (90 Base) MCG/ACT inhaler, Inhale 2 puffs into the lungs every 6 (six) hours as needed for wheezing or shortness of breath., Disp: 18 g, Rfl: 3   cyclobenzaprine (FLEXERIL) 5 MG tablet, Take 1 tablet (5 mg total) by mouth 2 (two) times daily as needed for muscle spasms., Disp: 60 tablet, Rfl: 0   EPINEPHrine (AUVI-Q) 0.3 mg/0.3 mL IJ SOAJ injection, Inject 0.3 mg into the muscle as needed for anaphylaxis., Disp: 1 each, Rfl: 1   famotidine (PEPCID) 40 MG tablet, Take 1 tablet (40 mg total) by mouth daily., Disp: 90 tablet, Rfl: 1   KLOR-CON 20 MEQ packet, Take 20 mEq by mouth daily., Disp: , Rfl:    levocetirizine (XYZAL) 5 MG tablet, Take 1 tablet (5 mg total) by mouth daily., Disp: 90 tablet, Rfl: 1   MAGNESIUM GLYCINATE PO, Take by mouth., Disp: , Rfl:    montelukast (SINGULAIR) 10 MG tablet, Take 1 tablet (10 mg total) by mouth at bedtime., Disp: 90 tablet, Rfl: 1   Omega-3 Fatty Acids (FISH OIL) 1000 MG CAPS, Take by mouth., Disp: , Rfl:    triamcinolone ointment (KENALOG) 0.5 %, Apply 1 Application topically 2 (two) times daily as needed., Disp: 30 g, Rfl: 5   triamterene-hydrochlorothiazide (MAXZIDE-25) 37.5-25 MG tablet, Take 1-2 tablets by mouth daily., Disp: , Rfl:    Objective:  There were no vitals filed for this visit.    There is no height or weight on file to calculate BMI.    Physical Exam:    ***   Electronically signed by:  [name] 4:17 PM 02/22/23

## 2023-02-23 ENCOUNTER — Ambulatory Visit (INDEPENDENT_AMBULATORY_CARE_PROVIDER_SITE_OTHER): Payer: 59 | Admitting: Sports Medicine

## 2023-02-23 VITALS — BP 120/82 | HR 92 | Ht 70.0 in | Wt 250.0 lb

## 2023-02-23 DIAGNOSIS — M9902 Segmental and somatic dysfunction of thoracic region: Secondary | ICD-10-CM | POA: Diagnosis not present

## 2023-02-23 DIAGNOSIS — G8929 Other chronic pain: Secondary | ICD-10-CM

## 2023-02-23 DIAGNOSIS — M9901 Segmental and somatic dysfunction of cervical region: Secondary | ICD-10-CM

## 2023-02-23 DIAGNOSIS — M9903 Segmental and somatic dysfunction of lumbar region: Secondary | ICD-10-CM | POA: Diagnosis not present

## 2023-02-23 DIAGNOSIS — M545 Low back pain, unspecified: Secondary | ICD-10-CM | POA: Diagnosis not present

## 2023-02-23 DIAGNOSIS — M9908 Segmental and somatic dysfunction of rib cage: Secondary | ICD-10-CM

## 2023-02-23 DIAGNOSIS — M9905 Segmental and somatic dysfunction of pelvic region: Secondary | ICD-10-CM

## 2023-02-23 NOTE — Patient Instructions (Signed)
Low back HEP  4 week follow up

## 2023-03-22 NOTE — Progress Notes (Deleted)
[name] [address] [place] [address] Phone: [phone]   Assessment and Plan:     There are no diagnoses linked to this encounter.  *** - Patient has received relief with OMT in the past.  Elects for repeat OMT today.  Tolerated well per note below. - Decision today to treat with OMT was based on Physical Exam   After verbal consent patient was treated with HVLA (high velocity low amplitude), ME (muscle energy), FPR (flex positional release), ST (soft tissue), PC/PD (Pelvic Compression/ Pelvic Decompression) techniques in cervical, rib, thoracic, lumbar, and pelvic areas. Patient tolerated the procedure well with improvement in symptoms.  Patient educated on potential side effects of soreness and recommended to rest, hydrate, and use Tylenol as needed for pain control.   Pertinent previous records reviewed include ***   Follow Up: ***     Subjective:   I, Randie Bloodgood, am serving as a Neurosurgeon for Doctor Richardean Sale   Chief Complaint: neck pain    HPI:    07/15/2022 Patient is a 47 year old female complaining of neck pain. Patient states that she had a chiro adjustment last week and has pain and dizziness since, went to the ED a couple of day ago,  she feels unsteady on her feet when she moves her head. She does not seem to notice if turning her head in any direction particular makes the unsteadiness worse. She is still having thoracic back discomfort.    08/05/2022 Patient states that she is better than it was , still has some dizziness, she was getting some catching when she does exercises , she feels like her atlas is out and she can hear heartbeat in her ear    09/02/2022 Patient states having an autoimmune flare, she states she is getting better gets injections tomorrow , tightness isnt as bad as it usually is    09/30/2022 Patient states that she Is pretty good , just here for an adjustment ,she has a bunion and would  like any recommendations    10/28/2022 Patient states that she is hurting spent 14 hours sitting in the hospital  waiting area   11/25/2022 Patient states she is tired and in some pain , has been sleeping on an air mattress    12/23/2022 Patient states that she is feeling good, thoracic is flared she has been sitting at the computer    01/20/2023 Patient states that she is a little sore but overall pretty good    02/23/2023 Patient states threw her back out last Sunday  10/8/ 2024 Patient states   Relevant Historical Information: Weakly positive ANA, Arnold-Chiari malformation  Additional pertinent review of systems negative.  Current Outpatient Medications  Medication Sig Dispense Refill   albuterol (VENTOLIN HFA) 108 (90 Base) MCG/ACT inhaler Inhale 2 puffs into the lungs every 6 (six) hours as needed for wheezing or shortness of breath. 18 g 3   cyclobenzaprine (FLEXERIL) 5 MG tablet Take 1 tablet (5 mg total) by mouth 2 (two) times daily as needed for muscle spasms. 60 tablet 0   EPINEPHrine (AUVI-Q) 0.3 mg/0.3 mL IJ SOAJ injection Inject 0.3 mg into the muscle as needed for anaphylaxis. 1 each 1   famotidine (PEPCID) 40 MG tablet Take 1 tablet (40 mg total) by mouth daily. 90 tablet 1   KLOR-CON 20 MEQ packet Take 20 mEq by mouth daily.     levocetirizine (XYZAL) 5 MG tablet Take 1 tablet (  5 mg total) by mouth daily. 90 tablet 1   MAGNESIUM GLYCINATE PO Take by mouth.     montelukast (SINGULAIR) 10 MG tablet Take 1 tablet (10 mg total) by mouth at bedtime. 90 tablet 1   Omega-3 Fatty Acids (FISH OIL) 1000 MG CAPS Take by mouth.     triamcinolone ointment (KENALOG) 0.5 % Apply 1 Application topically 2 (two) times daily as needed. 30 g 5   triamterene-hydrochlorothiazide (MAXZIDE-25) 37.5-25 MG tablet Take 1-2 tablets by mouth daily.     No current facility-administered medications for this visit.      Objective:     There were no vitals filed for this visit.    There is  no height or weight on file to calculate BMI.    Physical Exam:     General: Well-appearing, cooperative, sitting comfortably in no acute distress.   OMT Physical Exam:  ASIS Compression Test: Positive Right Cervical: TTP paraspinal, *** Rib: Bilateral elevated first rib with TTP Thoracic: TTP paraspinal,*** Lumbar: TTP paraspinal,*** Pelvis: Right anterior innominate  Electronically signed by:  [facility]leen [name] 4:21 PM 03/22/23

## 2023-03-23 ENCOUNTER — Ambulatory Visit: Payer: 59 | Admitting: Sports Medicine

## 2023-03-24 NOTE — Progress Notes (Deleted)
[name] [address] [place] [address] Phone: [phone]   Assessment and Plan:     There are no diagnoses linked to this encounter.  *** - Patient has received relief with OMT in the past.  Elects for repeat OMT today.  Tolerated well per note below. - Decision today to treat with OMT was based on Physical Exam   After verbal consent patient was treated with HVLA (high velocity low amplitude), ME (muscle energy), FPR (flex positional release), ST (soft tissue), PC/PD (Pelvic Compression/ Pelvic Decompression) techniques in cervical, rib, thoracic, lumbar, and pelvic areas. Patient tolerated the procedure well with improvement in symptoms.  Patient educated on potential side effects of soreness and recommended to rest, hydrate, and use Tylenol as needed for pain control.   Pertinent previous records reviewed include ***   Follow Up: ***     Subjective:   I, Caroline Gill, am serving as a Neurosurgeon for Doctor Richardean Sale   Chief Complaint: neck pain    HPI:    07/15/2022 Patient is a 47 year old female complaining of neck pain. Patient states that she had a chiro adjustment last week and has pain and dizziness since, went to the ED a couple of day ago,  she feels unsteady on her feet when she moves her head. She does not seem to notice if turning her head in any direction particular makes the unsteadiness worse. She is still having thoracic back discomfort.    08/05/2022 Patient states that she is better than it was , still has some dizziness, she was getting some catching when she does exercises , she feels like her atlas is out and she can hear heartbeat in her ear    09/02/2022 Patient states having an autoimmune flare, she states she is getting better gets injections tomorrow , tightness isnt as bad as it usually is    09/30/2022 Patient states that she Is pretty good , just here for an adjustment ,she has a bunion and would  like any recommendations    10/28/2022 Patient states that she is hurting spent 14 hours sitting in the hospital  waiting area   11/25/2022 Patient states she is tired and in some pain , has been sleeping on an air mattress    12/23/2022 Patient states that she is feeling good, thoracic is flared she has been sitting at the computer    01/20/2023 Patient states that she is a little sore but overall pretty good    02/23/2023 Patient states threw her back out last sunday  03/25/2023 Patient states    Relevant Historical Information: Weakly positive ANA, Arnold-Chiari malformation  Additional pertinent review of systems negative.  Current Outpatient Medications  Medication Sig Dispense Refill   albuterol (VENTOLIN HFA) 108 (90 Base) MCG/ACT inhaler Inhale 2 puffs into the lungs every 6 (six) hours as needed for wheezing or shortness of breath. 18 g 3   cyclobenzaprine (FLEXERIL) 5 MG tablet Take 1 tablet (5 mg total) by mouth 2 (two) times daily as needed for muscle spasms. 60 tablet 0   EPINEPHrine (AUVI-Q) 0.3 mg/0.3 mL IJ SOAJ injection Inject 0.3 mg into the muscle as needed for anaphylaxis. 1 each 1   famotidine (PEPCID) 40 MG tablet Take 1 tablet (40 mg total) by mouth daily. 90 tablet 1   KLOR-CON 20 MEQ packet Take 20 mEq by mouth daily.     levocetirizine (XYZAL) 5 MG tablet Take 1 tablet (  5 mg total) by mouth daily. 90 tablet 1   MAGNESIUM GLYCINATE PO Take by mouth.     montelukast (SINGULAIR) 10 MG tablet Take 1 tablet (10 mg total) by mouth at bedtime. 90 tablet 1   Omega-3 Fatty Acids (FISH OIL) 1000 MG CAPS Take by mouth.     triamcinolone ointment (KENALOG) 0.5 % Apply 1 Application topically 2 (two) times daily as needed. 30 g 5   triamterene-hydrochlorothiazide (MAXZIDE-25) 37.5-25 MG tablet Take 1-2 tablets by mouth daily.     No current facility-administered medications for this visit.      Objective:     There were no vitals filed for this visit.    There is  no height or weight on file to calculate BMI.    Physical Exam:     General: Well-appearing, cooperative, sitting comfortably in no acute distress.   OMT Physical Exam:  ASIS Compression Test: Positive Right Cervical: TTP paraspinal, *** Rib: Bilateral elevated first rib with TTP Thoracic: TTP paraspinal,*** Lumbar: TTP paraspinal,*** Pelvis: Right anterior innominate  Electronically signed by:  [facility]leen [name] 3:31 PM 03/24/23

## 2023-03-25 ENCOUNTER — Ambulatory Visit: Payer: 59 | Admitting: Sports Medicine

## 2023-03-30 NOTE — Progress Notes (Unsigned)
Caroline [name] [address] [place] [address] Phone: [phone]   Assessment and Plan:    1. Chronic bilateral low back pain without sciatica 2. Neck pain 3. Somatic dysfunction of cervical region 4. Somatic dysfunction of thoracic region 5. Somatic dysfunction of lumbar region 6. Somatic dysfunction of pelvic region 7. Somatic dysfunction of rib region  -Chronic with exacerbation, subsequent visit - Recurrence of multiple musculoskeletal complaints with most prominent being low back, hips, neck - Patient has been receiving relief with frequent OMT visits at 4-week intervals.  Pain is generally worse today since it has been 6 to 7 weeks since last OMT visit - Patient has received relief with OMT in the past.  Elects for repeat OMT today.  Tolerated well per note below. - Decision today to treat with OMT was based on Physical Exam   After verbal consent patient was treated with HVLA (high velocity low amplitude), ME (muscle energy), FPR (flex positional release), ST (soft tissue), PC/PD (Pelvic Compression/ Pelvic Decompression) techniques in cervical, rib, thoracic, lumbar, and pelvic areas. Patient tolerated the procedure well with improvement in symptoms.  Patient educated on potential side effects of soreness and recommended to rest, hydrate, and use Tylenol as needed for pain control.   Pertinent previous records reviewed include none   Follow Up: 4 weeks for reevaluation.  Could consider repeat OMT   Subjective:   I, Caroline Gill, am serving as a Neurosurgeon for Doctor Richardean Sale   Chief Complaint: neck pain    HPI:    07/15/2022 Patient is a 47 year old female complaining of neck pain. Patient states that she had a chiro adjustment last week and has pain and dizziness since, went to the ED a couple of day ago,  she feels unsteady on her feet when she moves her head. She does not seem to notice if turning her head in any direction  particular makes the unsteadiness worse. She is still having thoracic back discomfort.    08/05/2022 Patient states that she is better than it was , still has some dizziness, she was getting some catching when she does exercises , she feels like her atlas is out and she can hear heartbeat in her ear    09/02/2022 Patient states having an autoimmune flare, she states she is getting better gets injections tomorrow , tightness isnt as bad as it usually is    09/30/2022 Patient states that she Is pretty good , just here for an adjustment ,she has a bunion and would like any recommendations    10/28/2022 Patient states that she is hurting spent 14 hours sitting in the hospital  waiting area   11/25/2022 Patient states she is tired and in some pain , has been sleeping on an air mattress    12/23/2022 Patient states that she is feeling good, thoracic is flared she has been sitting at the computer    01/20/2023 Patient states that she is a little sore but overall pretty good    02/23/2023 Patient states threw her back out last sunday  04/13/2023 Patient states she is tight and has some hip pain but okay other than that    Relevant Historical Information: Weakly positive ANA, Arnold-Chiari malformation  Additional pertinent review of systems negative.  Current Outpatient Medications  Medication Sig Dispense Refill   albuterol (VENTOLIN HFA) 108 (90 Base) MCG/ACT inhaler Inhale 2 puffs into the lungs every 6 (six) hours as needed for wheezing  or shortness of breath. 18 g 3   budesonide-formoterol (SYMBICORT) 160-4.5 MCG/ACT inhaler Inhale 2 puffs into the lungs 2 (two) times daily. Inhalation for 30 Days     Carbinoxamine Maleate (RYVENT) 6 MG TABS Take 1 tablet (6 mg) by mouth 2 (two) times daily as needed. Oral for 30 Days     EPINEPHrine (AUVI-Q) 0.3 mg/0.3 mL IJ SOAJ injection Inject 0.3 mg into the muscle as needed for anaphylaxis. 1 each 1   KLOR-CON 20 MEQ packet Take 20 mEq by mouth  daily.     levocetirizine (XYZAL) 5 MG tablet Take 1 tablet (5 mg total) by mouth daily. 90 tablet 1   MAGNESIUM GLYCINATE PO Take by mouth.     meloxicam (MOBIC) 15 MG tablet Take 15 mg by mouth daily.     montelukast (SINGULAIR) 10 MG tablet Take 1 tablet (10 mg total) by mouth at bedtime. 90 tablet 1   Omega-3 Fatty Acids (FISH OIL) 1000 MG CAPS Take by mouth.     triamcinolone ointment (KENALOG) 0.5 % Apply 1 Application topically 2 (two) times daily as needed. 30 g 5   famotidine (PEPCID) 40 MG tablet Take 1 tablet (40 mg total) by mouth daily. 90 tablet 1   No current facility-administered medications for this visit.      Objective:     Vitals:   04/13/23 0835  BP: 118/84  Weight: 241 lb (109.3 kg)  Height: 5\' 10"  (1.778 m)      Body mass index is 34.58 kg/m.    Physical Exam:     General: Well-appearing, cooperative, sitting comfortably in no acute distress.   OMT Physical Exam:  ASIS Compression Test: Positive left Cervical: TTP paraspinal, C3 RR SL Rib: Bilateral elevated first rib with TTP Thoracic: TTP paraspinal, T6-8 RRSL Lumbar: TTP paraspinal, L1-3 RRSL Pelvis: Left anterior innominate  Electronically signed by:  Caroline [name] 10:30 AM 04/13/23

## 2023-04-08 ENCOUNTER — Ambulatory Visit: Payer: 59 | Admitting: Family Medicine

## 2023-04-08 ENCOUNTER — Encounter: Payer: Self-pay | Admitting: Family Medicine

## 2023-04-08 VITALS — BP 122/82 | HR 76 | Temp 97.4°F | Ht 70.47 in | Wt 241.2 lb

## 2023-04-08 DIAGNOSIS — Z131 Encounter for screening for diabetes mellitus: Secondary | ICD-10-CM | POA: Diagnosis not present

## 2023-04-08 DIAGNOSIS — Z23 Encounter for immunization: Secondary | ICD-10-CM

## 2023-04-08 DIAGNOSIS — E559 Vitamin D deficiency, unspecified: Secondary | ICD-10-CM

## 2023-04-08 DIAGNOSIS — R7982 Elevated C-reactive protein (CRP): Secondary | ICD-10-CM

## 2023-04-08 DIAGNOSIS — Z0000 Encounter for general adult medical examination without abnormal findings: Secondary | ICD-10-CM | POA: Diagnosis not present

## 2023-04-08 DIAGNOSIS — Z13220 Encounter for screening for lipoid disorders: Secondary | ICD-10-CM

## 2023-04-08 DIAGNOSIS — E66812 Obesity, class 2: Secondary | ICD-10-CM

## 2023-04-08 LAB — COMPREHENSIVE METABOLIC PANEL
ALT: 14 U/L (ref 0–35)
AST: 14 U/L (ref 0–37)
Albumin: 4.6 g/dL (ref 3.5–5.2)
Alkaline Phosphatase: 136 U/L — ABNORMAL HIGH (ref 39–117)
BUN: 15 mg/dL (ref 6–23)
CO2: 28 meq/L (ref 19–32)
Calcium: 9.7 mg/dL (ref 8.4–10.5)
Chloride: 101 meq/L (ref 96–112)
Creatinine, Ser: 0.73 mg/dL (ref 0.40–1.20)
GFR: 96.4 mL/min (ref >60.00)
Glucose, Bld: 105 mg/dL — ABNORMAL HIGH (ref 70–99)
Potassium: 4.3 meq/L (ref 3.5–5.1)
Sodium: 136 meq/L (ref 135–145)
Total Bilirubin: 0.5 mg/dL (ref 0.2–1.2)
Total Protein: 7.6 g/dL (ref 6.0–8.3)

## 2023-04-08 LAB — CBC WITH DIFFERENTIAL/PLATELET
Basophils Absolute: 0.1 10*3/uL (ref 0.0–0.1)
Basophils Relative: 0.9 % (ref 0.0–3.0)
Eosinophils Absolute: 0.4 10*3/uL (ref 0.0–0.7)
Eosinophils Relative: 4.5 % (ref 0.0–5.0)
HCT: 43 % (ref 36.0–46.0)
Hemoglobin: 13.7 g/dL (ref 12.0–15.0)
Lymphocytes Relative: 29.7 % (ref 12.0–46.0)
Lymphs Abs: 2.6 10*3/uL (ref 0.7–4.0)
MCHC: 31.9 g/dL (ref 30.0–36.0)
MCV: 90.6 fL (ref 78.0–100.0)
Monocytes Absolute: 0.5 10*3/uL (ref 0.1–1.0)
Monocytes Relative: 5.1 % (ref 3.0–12.0)
Neutro Abs: 5.3 10*3/uL (ref 1.4–7.7)
Neutrophils Relative %: 59.8 % (ref 43.0–77.0)
Platelets: 299 10*3/uL (ref 150.0–400.0)
RBC: 4.74 Mil/uL (ref 3.87–5.11)
RDW: 13.1 % (ref 11.5–15.5)
WBC: 8.9 10*3/uL (ref 4.0–10.5)

## 2023-04-08 LAB — HEMOGLOBIN A1C: Hgb A1c MFr Bld: 5.4 % (ref 4.6–6.5)

## 2023-04-08 LAB — LIPID PANEL
Cholesterol: 204 mg/dL — ABNORMAL HIGH (ref 0–200)
HDL: 48.1 mg/dL (ref >39.00)
LDL Cholesterol: 133 mg/dL — ABNORMAL HIGH (ref 0–99)
NonHDL: 155.58
Total CHOL/HDL Ratio: 4
Triglycerides: 114 mg/dL (ref 0.0–149.0)
VLDL: 22.8 mg/dL (ref 0.0–40.0)

## 2023-04-08 LAB — VITAMIN D 25 HYDROXY (VIT D DEFICIENCY, FRACTURES): VITD: 27.49 ng/mL — ABNORMAL LOW (ref 30.00–100.00)

## 2023-04-08 LAB — TSH: TSH: 3.06 u[IU]/mL (ref 0.35–5.50)

## 2023-04-08 NOTE — Progress Notes (Signed)
Patient ID: Caroline Gill, female  DOB: Nov 26, 1975, 47 y.o.   MRN: [id number] Patient Care Team    Relationship Specialty Notifications Start End  Natalia Leatherwood, DO PCP - General Family Medicine  01/07/22   Aletha Halim, MD Consulting Physician Neurology  01/07/22   Ronda Fairly. Nicanor Bake, MD Consulting Physician Cardiology  01/07/22   Verlee Monte, MD Consulting Physician Allergy and Immunology  01/07/22   Edwyna Shell, MD Referring Physician Hematology and Oncology  01/07/22   Rolland Bimler T  Gastroenterology  01/07/22    Comment: Concepcion Living, MD Referring Physician Pulmonary Disease  01/07/22     Chief Complaint  Patient presents with   Annual Exam    Pt is not fasting    Subjective: Caroline Gill is a 47 y.o.  Female  present for CPE All past medical history, surgical history, allergies, family history, immunizations, medications and social history were updated in the electronic medical record today. All recent labs, ED visits and hospitalizations within the last year were reviewed.  Health maintenance:  Colonoscopy: completed UTD 11/2020 [facility]- 10 yr Mammogram: completed:UTD 01/2023- GYN Cervical cancer screening: UTD 2023- gyn Immunizations: tdap UTD 2023, Influenza declined (encouraged yearly), discussed shingrix series after 50 (ok by nurse visit if desired) Infectious disease screening: HIV completed and  Hep C screen completed DEXA: routine screen       04/08/2023    8:13 AM 04/06/2022    8:45 AM 01/07/2022   10:02 AM  Depression screen PHQ 2/9  Decreased Interest 1 0 3  Down, Depressed, Hopeless 1 0 1  PHQ - 2 Score 2 0 4  Altered sleeping  1   Tired, decreased energy  1   Change in appetite  0   Feeling bad or failure about yourself  3 1   Trouble concentrating 1 1   Moving slowly or fidgety/restless 0 0   Suicidal thoughts 0 0   PHQ-9 Score  4   Difficult doing work/chores Somewhat difficult        04/06/2022    8:46 AM  GAD 7 :  Generalized Anxiety Score  Nervous, Anxious, on Edge 1  Control/stop worrying 0  Worry too much - different things 1  Trouble relaxing 1  Restless 1  Easily annoyed or irritable 1  Afraid - awful might happen 0  Total GAD 7 Score 5    Immunization History  Administered Date(s) Administered   PFIZER(Purple Top)SARS-COV-2 Vaccination 12/06/2019, 01/12/2020   Tdap 01/07/2022    Past Medical History:  Diagnosis Date   Arthritis    Asthma    Atrial fibrillation (HCC)    Chicken pox    Depression    Elevated WBC count 01/07/2022   Migraine    SVT (supraventricular tachycardia) (HCC)    Allergies  Allergen Reactions   Oxycodone Other (See Comments)    Severe headache Severe headache Severe headache   Tapentadol Rash   Past Surgical History:  Procedure Laterality Date   BREAST BIOPSY  2020   CHOLECYSTECTOMY  2013   ENDOMETRIAL ABLATION  2013   KNEE ARTHROSCOPY Right 2018   meniscus   KNEE SURGERY Left 1992   "dislox"   SHOULDER ARTHROSCOPY  2018   Labral tear with anchors x3   VAGINAL DELIVERY     X3, 1996, 1999, 2002   Family History  Problem Relation Age of Onset   Skin cancer Mother    Asthma Mother  Osteoarthritis Mother    Thyroid disease Mother    Diabetes Mother    Stroke Mother    Atrial fibrillation Mother    Alcohol abuse Mother    Depression Mother    Hypertension Father    Osteoarthritis Father    Diabetes Father    Atrial fibrillation Father    Stroke Father    Hypertension Brother    Osteoarthritis Brother    Alcohol abuse Brother    Asthma Brother    Drug abuse Brother    Skin cancer Brother    Hypertension Maternal Grandmother    Diabetes Maternal Grandmother    Depression Maternal Grandmother    Osteoarthritis Maternal Grandmother    Miscarriages / Stillbirths Maternal Grandmother    Bone cancer Maternal Grandfather        late 21s   Dementia Maternal Grandfather    Rheum arthritis Paternal Grandmother    Depression Paternal  Grandmother    Migraines Paternal Grandmother    Heart attack Paternal Grandfather 86   Depression Daughter    Anxiety disorder Daughter    ADD / ADHD Daughter    ADD / ADHD Son    Hypothyroidism Son    ADD / ADHD Son    Angioedema Neg Hx    Allergic rhinitis Neg Hx    Immunodeficiency Neg Hx    Urticaria Neg Hx    Eczema Neg Hx    Social History   Social History Narrative   Marital status/children/pets: Married.  3 children.   Education/employment: College-educated.  Owner/CFO-employed   Safety:      -Wears a bicycle helmet riding a bike: Yes     -smoke alarm in the home:Yes     - wears seatbelt: Yes     - Feels safe in their relationships: Yes       Allergies as of 04/08/2023       Reactions   Oxycodone Other (See Comments)   Severe headache Severe headache Severe headache   Tapentadol Rash        Medication List        Accurate as of April 08, 2023  8:37 AM. If you have any questions, ask your nurse or doctor.          STOP taking these medications    cyclobenzaprine 5 MG tablet Commonly known as: FLEXERIL Stopped by: Felix Pacini   triamterene-hydrochlorothiazide 37.5-25 MG tablet Commonly known as: MAXZIDE-25 Stopped by: Felix Pacini       TAKE these medications    albuterol 108 (90 Base) MCG/ACT inhaler Commonly known as: VENTOLIN HFA Inhale 2 puffs into the lungs every 6 (six) hours as needed for wheezing or shortness of breath.   EPINEPHrine 0.3 mg/0.3 mL Soaj injection Commonly known as: Auvi-Q Inject 0.3 mg into the muscle as needed for anaphylaxis.   famotidine 40 MG tablet Commonly known as: Pepcid Take 1 tablet (40 mg total) by mouth daily.   Fish Oil 1000 MG Caps Take by mouth.   Klor-Con 20 MEQ packet Generic drug: potassium chloride Take 20 mEq by mouth daily.   levocetirizine 5 MG tablet Commonly known as: XYZAL Take 1 tablet (5 mg total) by mouth daily.   MAGNESIUM GLYCINATE PO Take by mouth.   meloxicam 15  MG tablet Commonly known as: MOBIC Take 15 mg by mouth daily.   montelukast 10 MG tablet Commonly known as: SINGULAIR Take 1 tablet (10 mg total) by mouth at bedtime.   RyVent 6 MG Tabs  Generic drug: Carbinoxamine Maleate Take 1 tablet (6 mg) by mouth 2 (two) times daily as needed. Oral for 30 Days   Symbicort 160-4.5 MCG/ACT inhaler Generic drug: budesonide-formoterol Inhale 2 puffs into the lungs 2 (two) times daily. Inhalation for 30 Days   triamcinolone ointment 0.5 % Commonly known as: KENALOG Apply 1 Application topically 2 (two) times daily as needed.        All past medical history, surgical history, allergies, family history, immunizations andmedications were updated in the EMR today and reviewed under the history and medication portions of their EMR.      ROS 14 pt review of systems performed and negative (unless mentioned in an HPI)  Objective: BP 122/82   Pulse 76   Temp (!) 97.4 F (36.3 C)   Ht 5' 10.47" (1.79 m)   Wt 241 lb 3.2 oz (109.4 kg)   SpO2 98%   BMI 34.15 kg/m  Physical Exam Vitals and nursing note reviewed.  Constitutional:      General: She is not in acute distress.    Appearance: Normal appearance. She is not ill-appearing or toxic-appearing.  HENT:     Head: Normocephalic and atraumatic.     Right Ear: Tympanic membrane, ear canal and external ear normal. There is no impacted cerumen.     Left Ear: Tympanic membrane, ear canal and external ear normal. There is no impacted cerumen.     Nose: No congestion or rhinorrhea.     Mouth/Throat:     Mouth: Mucous membranes are moist.     Pharynx: Oropharynx is clear. No oropharyngeal exudate or posterior oropharyngeal erythema.  Eyes:     General: No scleral icterus.       Right eye: No discharge.        Left eye: No discharge.     Extraocular Movements: Extraocular movements intact.     Conjunctiva/sclera: Conjunctivae normal.     Pupils: Pupils are equal, round, and reactive to light.   Cardiovascular:     Rate and Rhythm: Normal rate and regular rhythm.     Pulses: Normal pulses.     Heart sounds: Normal heart sounds. No murmur heard.    No friction rub. No gallop.  Pulmonary:     Effort: Pulmonary effort is normal. No respiratory distress.     Breath sounds: Normal breath sounds. No stridor. No wheezing, rhonchi or rales.  Chest:     Chest wall: No tenderness.  Abdominal:     General: Abdomen is flat. Bowel sounds are normal. There is no distension.     Palpations: Abdomen is soft. There is no mass.     Tenderness: There is no abdominal tenderness. There is no right CVA tenderness, left CVA tenderness, guarding or rebound.     Hernia: No hernia is present.  Musculoskeletal:        General: No swelling, tenderness or deformity. Normal range of motion.     Cervical back: Normal range of motion and neck supple. No rigidity or tenderness.     Right lower leg: No edema.     Left lower leg: No edema.  Lymphadenopathy:     Cervical: No cervical adenopathy.  Skin:    General: Skin is warm and dry.     Coloration: Skin is not jaundiced or pale.     Findings: No bruising, erythema, lesion or rash.  Neurological:     General: No focal deficit present.     Mental Status: She is alert and  oriented to person, place, and time. Mental status is at baseline.     Cranial Nerves: No cranial nerve deficit.     Sensory: No sensory deficit.     Motor: No weakness.     Coordination: Coordination normal.     Gait: Gait normal.     Deep Tendon Reflexes: Reflexes normal.  Psychiatric:        Mood and Affect: Mood normal.        Behavior: Behavior normal.        Thought Content: Thought content normal.        Judgment: Judgment normal.       No results found.  Assessment/plan: Caroline Gill is a 46 y.o. female present for CPE  CRP elevated avoiding gluten , following w/  rheumatology and allergist Vitamin D deficiency - Vitamin D (25 hydroxy)  Routine general medical  examination at a health care facility Patient was encouraged to exercise greater than 150 minutes a week. Patient was encouraged to choose a diet filled with fresh fruits and vegetables, and lean meats. AVS provided to patient today for education/recommendation on gender specific health and safety maintenance. Colonoscopy: completed UTD 11/2020 [facility]- 10 yr Mammogram: completed:UTD 01/2023- GYN Cervical cancer screening: UTD 2023- gyn Immunizations: tdap UTD 2023, Influenza declined (encouraged yearly), discussed shingrix series after 50 (ok by nurse visit if desired) Infectious disease screening: HIV completed and  Hep C screen completed DEXA: routine screen   Return in about 1 year (around 04/08/2024) for cpe (20 min).  Orders Placed This Encounter  Procedures   Comprehensive metabolic panel   Hemoglobin A1c   TSH   Lipid panel   Vitamin D (25 hydroxy)   CBC w/Diff   No orders of the defined types were placed in this encounter.  Referral Orders  No referral(s) requested today     Electronically signed by: Felix Pacini, DO [facility] [place]

## 2023-04-08 NOTE — Patient Instructions (Addendum)
Return in about 1 year (around 04/08/2024) for cpe (20 min).        Great to see you today.  I have refilled the medication(s) we provide.   If labs were collected or images ordered, we will inform you of  results once we have received them and reviewed. We will contact you either by echart message, or telephone call.  Please give ample time to the testing facility, and our office to run,  receive and review results. Please do not call inquiring of results, even if you can see them in your chart. We will contact you as soon as we are able. If it has been over 1 week since the test was completed, and you have not yet heard from [place], then please call us.    - echart message- for normal results that have been seen by the patient already.   - telephone call: abnormal results or if patient has not viewed results in their echart.  If a referral to a specialist was entered for you, please call us in 2 weeks if you have not heard from the specialist office to schedule.    If you are interested in weight loss counseling please make appt to discuss and bring with you 2 weeks of a food diary/log on notebook paper.  Food log is mandatory on first appt to proceed with counseling.  Weight loss counseling encompasses diet, exercise and can include  medications when appropriate and affordable.  There are routine appts for check-ins and weights to track progress and keep you on track.  Routine check-ins (in person) are also mandatory to continue with prescription refills. Check-in timeline  can range from 4 weeks to 12 weeks, depending on physician's recommendations and which step you are in of your weight loss journey.  Your BMI today is Body mass index is 34.15 kg/m.  Please check with your insurance prior to appt and ask them if they cover weight loss medications for your BMI? And if so, which medications. They may tell you some of the diabetes meds that are used for weight loss also,  are on your  formulary- but this does not mean they are covered for weight loss only.  Even if they tell with a prior auth it is covered- make sure they check to see if you personally meet criteria with your BMI.

## 2023-04-13 ENCOUNTER — Ambulatory Visit (INDEPENDENT_AMBULATORY_CARE_PROVIDER_SITE_OTHER): Payer: 59 | Admitting: Sports Medicine

## 2023-04-13 VITALS — BP 118/84 | Ht 70.0 in | Wt 241.0 lb

## 2023-04-13 DIAGNOSIS — M9905 Segmental and somatic dysfunction of pelvic region: Secondary | ICD-10-CM

## 2023-04-13 DIAGNOSIS — M542 Cervicalgia: Secondary | ICD-10-CM

## 2023-04-13 DIAGNOSIS — M9903 Segmental and somatic dysfunction of lumbar region: Secondary | ICD-10-CM

## 2023-04-13 DIAGNOSIS — M545 Low back pain, unspecified: Secondary | ICD-10-CM | POA: Diagnosis not present

## 2023-04-13 DIAGNOSIS — M9902 Segmental and somatic dysfunction of thoracic region: Secondary | ICD-10-CM | POA: Diagnosis not present

## 2023-04-13 DIAGNOSIS — M9901 Segmental and somatic dysfunction of cervical region: Secondary | ICD-10-CM | POA: Diagnosis not present

## 2023-04-13 DIAGNOSIS — M9908 Segmental and somatic dysfunction of rib cage: Secondary | ICD-10-CM

## 2023-04-13 DIAGNOSIS — G8929 Other chronic pain: Secondary | ICD-10-CM | POA: Diagnosis not present

## 2023-04-26 ENCOUNTER — Ambulatory Visit: Payer: 59 | Admitting: Family Medicine

## 2023-04-26 ENCOUNTER — Encounter: Payer: Self-pay | Admitting: Family Medicine

## 2023-04-26 VITALS — BP 112/80 | HR 78 | Temp 98.2°F | Wt 254.2 lb

## 2023-04-26 DIAGNOSIS — J4521 Mild intermittent asthma with (acute) exacerbation: Secondary | ICD-10-CM | POA: Diagnosis not present

## 2023-04-26 MED ORDER — PREDNISONE 50 MG PO TABS: 50.0000 mg | ORAL_TABLET | Freq: Every day | ORAL | 0 refills | Status: DC

## 2023-04-26 MED ORDER — AZITHROMYCIN 250 MG PO TABS: ORAL_TABLET | ORAL | 0 refills | Status: AC

## 2023-04-26 NOTE — Patient Instructions (Signed)

## 2023-04-26 NOTE — Progress Notes (Signed)
Caroline Gill , Nov 05, 1975, 47 y.o., female MRN: [id number] Patient Care Team    Relationship Specialty Notifications Start End  Natalia Leatherwood, DO PCP - General Family Medicine  01/07/22   Aletha Halim, MD Consulting Physician Neurology  01/07/22   Ronda Fairly. Nicanor Bake, MD Consulting Physician Cardiology  01/07/22   Verlee Monte, MD Consulting Physician Allergy and Immunology  01/07/22   Edwyna Shell, MD Referring Physician Hematology and Oncology  01/07/22   Rolland Bimler T  Gastroenterology  01/07/22    Comment: Concepcion Living, MD Referring Physician Pulmonary Disease  01/07/22     Chief Complaint  Patient presents with   Wheezing    This weekend states she started to taste "infection" when she coughed     Subjective: Caroline Gill is a 47 y.o. Pt presents for an OV with complaints of cough, lung discomfort, wheezing of >1 week duration.  Associated symptoms include "tasting infection" this weekend with increased sputum production.   Pt has tried mucinex, xyzal, Symbicort and albuterol to ease their symptoms.      04/08/2023    8:13 AM 04/06/2022    8:45 AM 01/07/2022   10:02 AM  Depression screen PHQ 2/9  Decreased Interest 1 0 3  Down, Depressed, Hopeless 1 0 1  PHQ - 2 Score 2 0 4  Altered sleeping  1   Tired, decreased energy  1   Change in appetite  0   Feeling bad or failure about yourself  3 1   Trouble concentrating 1 1   Moving slowly or fidgety/restless 0 0   Suicidal thoughts 0 0   PHQ-9 Score  4   Difficult doing work/chores Somewhat difficult      Allergies  Allergen Reactions   Oxycodone Other (See Comments)    Severe headache Severe headache Severe headache   Tapentadol Rash   Social History   Social History Narrative   Marital status/children/pets: Married.  3 children.   Education/employment: College-educated.  Owner/CFO-employed   Safety:      -Wears a bicycle helmet riding a bike: Yes     -smoke alarm in the home:Yes      - wears seatbelt: Yes     - Feels safe in their relationships: Yes      Past Medical History:  Diagnosis Date   Arthritis    Asthma    Atrial fibrillation (HCC)    Chicken pox    Depression    Elevated WBC count 01/07/2022   Migraine    SVT (supraventricular tachycardia) (HCC)    Past Surgical History:  Procedure Laterality Date   BREAST BIOPSY  2020   CHOLECYSTECTOMY  2013   ENDOMETRIAL ABLATION  2013   KNEE ARTHROSCOPY Right 2018   meniscus   KNEE SURGERY Left 1992   "dislox"   SHOULDER ARTHROSCOPY  2018   Labral tear with anchors x3   VAGINAL DELIVERY     X3, 1996, 1999, 2002   Family History  Problem Relation Age of Onset   Skin cancer Mother    Asthma Mother    Osteoarthritis Mother    Thyroid disease Mother    Diabetes Mother    Stroke Mother    Atrial fibrillation Mother    Alcohol abuse Mother    Depression Mother    Hypertension Father    Osteoarthritis Father    Diabetes Father    Atrial fibrillation Father    Stroke Father  Hypertension Brother    Osteoarthritis Brother    Alcohol abuse Brother    Asthma Brother    Drug abuse Brother    Skin cancer Brother    Hypertension Maternal Grandmother    Diabetes Maternal Grandmother    Depression Maternal Grandmother    Osteoarthritis Maternal Grandmother    Miscarriages / Stillbirths Maternal Grandmother    Bone cancer Maternal Grandfather        late 53s   Dementia Maternal Grandfather    Rheum arthritis Paternal Grandmother    Depression Paternal Grandmother    Migraines Paternal Grandmother    Heart attack Paternal Grandfather 29   Depression Daughter    Anxiety disorder Daughter    ADD / ADHD Daughter    ADD / ADHD Son    Hypothyroidism Son    ADD / ADHD Son    Angioedema Neg Hx    Allergic rhinitis Neg Hx    Immunodeficiency Neg Hx    Urticaria Neg Hx    Eczema Neg Hx    Allergies as of 04/26/2023       Reactions   Oxycodone Other (See Comments)   Severe headache Severe  headache Severe headache   Tapentadol Rash        Medication List        Accurate as of April 26, 2023  2:34 PM. If you have any questions, ask your nurse or doctor.          albuterol 108 (90 Base) MCG/ACT inhaler Commonly known as: VENTOLIN HFA Inhale 2 puffs into the lungs every 6 (six) hours as needed for wheezing or shortness of breath.   azithromycin 250 MG tablet Commonly known as: ZITHROMAX Take 2 tablets on day 1, then 1 tablet daily on days 2 through 5 Started by: Felix Pacini   EPINEPHrine 0.3 mg/0.3 mL Soaj injection Commonly known as: Auvi-Q Inject 0.3 mg into the muscle as needed for anaphylaxis.   famotidine 40 MG tablet Commonly known as: Pepcid Take 1 tablet (40 mg total) by mouth daily.   Fish Oil 1000 MG Caps Take by mouth.   Klor-Con 20 MEQ packet Generic drug: potassium chloride Take 20 mEq by mouth daily.   levocetirizine 5 MG tablet Commonly known as: XYZAL Take 1 tablet (5 mg total) by mouth daily.   MAGNESIUM GLYCINATE PO Take by mouth.   meloxicam 15 MG tablet Commonly known as: MOBIC Take 15 mg by mouth daily.   montelukast 10 MG tablet Commonly known as: SINGULAIR Take 1 tablet (10 mg total) by mouth at bedtime.   predniSONE 50 MG tablet Commonly known as: DELTASONE Take 1 tablet (50 mg total) by mouth daily with breakfast. Started by: Felix Pacini   RyVent 6 MG Tabs Generic drug: Carbinoxamine Maleate Take 1 tablet (6 mg) by mouth 2 (two) times daily as needed. Oral for 30 Days   Symbicort 160-4.5 MCG/ACT inhaler Generic drug: budesonide-formoterol Inhale 2 puffs into the lungs 2 (two) times daily. Inhalation for 30 Days   triamcinolone ointment 0.5 % Commonly known as: KENALOG Apply 1 Application topically 2 (two) times daily as needed.        All past medical history, surgical history, allergies, family history, immunizations andmedications were updated in the EMR today and reviewed under the history and  medication portions of their EMR.     Review of Systems  Constitutional:  Positive for malaise/fatigue. Negative for chills and fever.  HENT:  Positive for congestion. Negative for ear  discharge, ear pain, nosebleeds, sinus pain and sore throat.   Eyes:  Negative for discharge.  Respiratory:  Positive for cough, sputum production and wheezing. Negative for stridor.   Musculoskeletal:  Negative for myalgias.  Skin:  Negative for rash.  Neurological:  Negative for dizziness and headaches.   Negative, with the exception of above mentioned in HPI   Objective:  BP 112/80   Pulse 78   Temp 98.2 F (36.8 C)   Wt 254 lb 3.2 oz (115.3 kg)   SpO2 98%   BMI 36.47 kg/m  Body mass index is 36.47 kg/m. Physical Exam Vitals and nursing note reviewed.  Constitutional:      General: She is not in acute distress.    Appearance: Normal appearance. She is normal weight. She is not ill-appearing or toxic-appearing.  HENT:     Head: Normocephalic and atraumatic.  Eyes:     General: No scleral icterus.       Right eye: No discharge.        Left eye: No discharge.     Extraocular Movements: Extraocular movements intact.     Conjunctiva/sclera: Conjunctivae normal.     Pupils: Pupils are equal, round, and reactive to light.  Skin:    Findings: No rash.  Neurological:     Mental Status: She is alert and oriented to person, place, and time. Mental status is at baseline.     Motor: No weakness.     Coordination: Coordination normal.     Gait: Gait normal.  Psychiatric:        Mood and Affect: Mood normal.        Behavior: Behavior normal.        Thought Content: Thought content normal.        Judgment: Judgment normal.      No results found. No results found. No results found for this or any previous visit (from the past 24 hour(s)).  Assessment/Plan: Ted Alejandro is a 47 y.o. female present for OV for  Mild intermittent asthma with exacerbation Rest, hydrate.  +/- flonase,  mucinex (DM if cough), nettie pot or nasal saline.  Z-pack and prednisone burst  If cough present it can last up to 6-8 weeks.  Continue allergy med, Symbicort, albuterol.  F/U 2 weeks if not improved.    Reviewed expectations re: course of current medical issues. Discussed self-management of symptoms. Outlined signs and symptoms indicating need for more acute intervention. Patient verbalized understanding and all questions were answered. Patient received an After-Visit Summary.    No orders of the defined types were placed in this encounter.  Meds ordered this encounter  Medications   azithromycin (ZITHROMAX) 250 MG tablet    Sig: Take 2 tablets on day 1, then 1 tablet daily on days 2 through 5    Dispense:  6 tablet    Refill:  0   predniSONE (DELTASONE) 50 MG tablet    Sig: Take 1 tablet (50 mg total) by mouth daily with breakfast.    Dispense:  5 tablet    Refill:  0   Referral Orders  No referral(s) requested today     Note is dictated utilizing voice recognition software. Although note has been proof read prior to signing, occasional typographical errors still can be missed. If any questions arise, please do not hesitate to call for verification.   electronically signed by:  Felix Pacini, DO  [facility] [place]

## 2023-05-07 NOTE — Progress Notes (Unsigned)
Aleen Sells [name] [address] [place] [address] Phone: [phone]   Assessment and Plan:     There are no diagnoses linked to this encounter.  *** - Patient has received relief with OMT in the past.  Elects for repeat OMT today.  Tolerated well per note below. - Decision today to treat with OMT was based on Physical Exam   After verbal consent patient was treated with HVLA (high velocity low amplitude), ME (muscle energy), FPR (flex positional release), ST (soft tissue), PC/PD (Pelvic Compression/ Pelvic Decompression) techniques in cervical, rib, thoracic, lumbar, and pelvic areas. Patient tolerated the procedure well with improvement in symptoms.  Patient educated on potential side effects of soreness and recommended to rest, hydrate, and use Tylenol as needed for pain control.   Pertinent previous records reviewed include ***    Follow Up: ***     Subjective:   I, Caroline Gill, am serving as a Neurosurgeon for Doctor Richardean Sale   Chief Complaint: neck pain    HPI:    07/15/2022 Patient is a 47 year old female complaining of neck pain. Patient states that she had a chiro adjustment last week and has pain and dizziness since, went to the ED a couple of day ago,  she feels unsteady on her feet when she moves her head. She does not seem to notice if turning her head in any direction particular makes the unsteadiness worse. She is still having thoracic back discomfort.    08/05/2022 Patient states that she is better than it was , still has some dizziness, she was getting some catching when she does exercises , she feels like her atlas is out and she can hear heartbeat in her ear    09/02/2022 Patient states having an autoimmune flare, she states she is getting better gets injections tomorrow , tightness isnt as bad as it usually is    09/30/2022 Patient states that she Is pretty good , just here for an adjustment ,she has a bunion and would  like any recommendations    10/28/2022 Patient states that she is hurting spent 14 hours sitting in the hospital  waiting area   11/25/2022 Patient states she is tired and in some pain , has been sleeping on an air mattress    12/23/2022 Patient states that she is feeling good, thoracic is flared she has been sitting at the computer    01/20/2023 Patient states that she is a little sore but overall pretty good    02/23/2023 Patient states threw her back out last sunday   04/13/2023 Patient states she is tight and has some hip pain but okay other than that   05/11/2023 Patient states  Relevant Historical Information: Weakly positive ANA, Arnold-Chiari malformation  Additional pertinent review of systems negative.  Current Outpatient Medications  Medication Sig Dispense Refill   albuterol (VENTOLIN HFA) 108 (90 Base) MCG/ACT inhaler Inhale 2 puffs into the lungs every 6 (six) hours as needed for wheezing or shortness of breath. 18 g 3   budesonide-formoterol (SYMBICORT) 160-4.5 MCG/ACT inhaler Inhale 2 puffs into the lungs 2 (two) times daily. Inhalation for 30 Days     Carbinoxamine Maleate (RYVENT) 6 MG TABS Take 1 tablet (6 mg) by mouth 2 (two) times daily as needed. Oral for 30 Days     EPINEPHrine (AUVI-Q) 0.3 mg/0.3 mL IJ SOAJ injection Inject 0.3 mg into the muscle as needed for anaphylaxis. 1 each 1  famotidine (PEPCID) 40 MG tablet Take 1 tablet (40 mg total) by mouth daily. 90 tablet 1   KLOR-CON 20 MEQ packet Take 20 mEq by mouth daily.     levocetirizine (XYZAL) 5 MG tablet Take 1 tablet (5 mg total) by mouth daily. 90 tablet 1   MAGNESIUM GLYCINATE PO Take by mouth.     meloxicam (MOBIC) 15 MG tablet Take 15 mg by mouth daily.     montelukast (SINGULAIR) 10 MG tablet Take 1 tablet (10 mg total) by mouth at bedtime. 90 tablet 1   Omega-3 Fatty Acids (FISH OIL) 1000 MG CAPS Take by mouth.     predniSONE (DELTASONE) 50 MG tablet Take 1 tablet (50 mg total) by mouth daily  with breakfast. 5 tablet 0   triamcinolone ointment (KENALOG) 0.5 % Apply 1 Application topically 2 (two) times daily as needed. 30 g 5   No current facility-administered medications for this visit.      Objective:     There were no vitals filed for this visit.    There is no height or weight on file to calculate BMI.    Physical Exam:     General: Well-appearing, cooperative, sitting comfortably in no acute distress.   OMT Physical Exam:  ASIS Compression Test: Positive Right Cervical: TTP paraspinal, *** Rib: Bilateral elevated first rib with TTP Thoracic: TTP paraspinal,*** Lumbar: TTP paraspinal,*** Pelvis: Right anterior innominate  Electronically signed by:  Aleen Sells [name] 10:22 AM 05/07/23

## 2023-05-11 ENCOUNTER — Ambulatory Visit (INDEPENDENT_AMBULATORY_CARE_PROVIDER_SITE_OTHER): Payer: 59 | Admitting: Sports Medicine

## 2023-05-11 VITALS — BP 118/82 | HR 64 | Ht 70.0 in | Wt 247.0 lb

## 2023-05-11 DIAGNOSIS — M542 Cervicalgia: Secondary | ICD-10-CM

## 2023-05-11 DIAGNOSIS — M545 Low back pain, unspecified: Secondary | ICD-10-CM

## 2023-05-11 DIAGNOSIS — M9902 Segmental and somatic dysfunction of thoracic region: Secondary | ICD-10-CM

## 2023-05-11 DIAGNOSIS — M9903 Segmental and somatic dysfunction of lumbar region: Secondary | ICD-10-CM

## 2023-05-11 DIAGNOSIS — G8929 Other chronic pain: Secondary | ICD-10-CM

## 2023-05-11 DIAGNOSIS — M9908 Segmental and somatic dysfunction of rib cage: Secondary | ICD-10-CM

## 2023-05-11 DIAGNOSIS — M9901 Segmental and somatic dysfunction of cervical region: Secondary | ICD-10-CM

## 2023-05-11 DIAGNOSIS — M9905 Segmental and somatic dysfunction of pelvic region: Secondary | ICD-10-CM

## 2023-05-11 MED ORDER — CYCLOBENZAPRINE HCL 5 MG PO TABS: 5.0000 mg | ORAL_TABLET | Freq: Every evening | ORAL | 0 refills | Status: DC | PRN

## 2023-05-18 ENCOUNTER — Other Ambulatory Visit: Payer: Self-pay

## 2023-05-18 ENCOUNTER — Telehealth (INDEPENDENT_AMBULATORY_CARE_PROVIDER_SITE_OTHER): Payer: 59 | Admitting: Family Medicine

## 2023-05-18 ENCOUNTER — Encounter: Payer: Self-pay | Admitting: Family Medicine

## 2023-05-18 DIAGNOSIS — J988 Other specified respiratory disorders: Secondary | ICD-10-CM

## 2023-05-18 DIAGNOSIS — B9689 Other specified bacterial agents as the cause of diseases classified elsewhere: Secondary | ICD-10-CM

## 2023-05-18 MED ORDER — DOXYCYCLINE HYCLATE 100 MG PO TABS: 100.0000 mg | ORAL_TABLET | Freq: Two times a day (BID) | ORAL | 0 refills | Status: DC

## 2023-05-18 MED ORDER — MONTELUKAST SODIUM 10 MG PO TABS: 10.0000 mg | ORAL_TABLET | Freq: Every day | ORAL | 1 refills | Status: DC

## 2023-05-18 MED ORDER — BENZONATATE 200 MG PO CAPS: 200.0000 mg | ORAL_CAPSULE | Freq: Two times a day (BID) | ORAL | 0 refills | Status: DC | PRN

## 2023-05-18 NOTE — Progress Notes (Signed)
VIRTUAL VISIT VIA VIDEO  I connected with Caroline Gill on 05/18/23 at 10:00 AM EST by a video enabled telemedicine application and verified that I am speaking with the correct person using two identifiers. Location patient: Home Location provider: [facility], Office Persons participating in the virtual visit: Patient, Dr. Claiborne Billings and Ivonne Andrew, CMA  I discussed the limitations of evaluation and management by telemedicine and the availability of in person appointments. The patient expressed understanding and agreed to proceed.   Caroline Gill , 25-Jan-1976, 47 y.o., female MRN: [id number] Patient Care Team    Relationship Specialty Notifications Start End  Natalia Leatherwood, DO PCP - General Family Medicine  01/07/22   Aletha Halim, MD Consulting Physician Neurology  01/07/22   Ronda Fairly. Nicanor Bake, MD Consulting Physician Cardiology  01/07/22   Verlee Monte, MD Consulting Physician Allergy and Immunology  01/07/22   Edwyna Shell, MD Referring Physician Hematology and Oncology  01/07/22   Rolland Bimler T  Gastroenterology  01/07/22    Comment: Concepcion Living, MD Referring Physician Pulmonary Disease  01/07/22     Chief Complaint  Patient presents with   Cough    Chest congestion 5 days; COVID neg on saturday     Subjective: Caroline Gill is a 47 y.o. Pt presents for an OV with complaints of chest congestion of 5 days duration.  Associated symptoms include cough, sinus pressure, chest tightness and sore throat.  She did take a COVID test that was negative for the weekend.. Pt has tried DayQuil, Mucinex, albuterol inhaler, Symbicort inhaler to ease their symptoms.  She has been out of hospital/rehab centers caring for her mom recently.     04/08/2023    8:13 AM 04/06/2022    8:45 AM 01/07/2022   10:02 AM  Depression screen PHQ 2/9  Decreased Interest 1 0 3  Down, Depressed, Hopeless 1 0 1  PHQ - 2 Score 2 0 4  Altered sleeping  1   Tired, decreased energy  1    Change in appetite  0   Feeling bad or failure about yourself  3 1   Trouble concentrating 1 1   Moving slowly or fidgety/restless 0 0   Suicidal thoughts 0 0   PHQ-9 Score  4   Difficult doing work/chores Somewhat difficult      Allergies  Allergen Reactions   Oxycodone Other (See Comments)    Severe headache Severe headache Severe headache   Tapentadol Rash   Social History   Social History Narrative   Marital status/children/pets: Married.  3 children.   Education/employment: College-educated.  Owner/CFO-employed   Safety:      -Wears a bicycle helmet riding a bike: Yes     -smoke alarm in the home:Yes     - wears seatbelt: Yes     - Feels safe in their relationships: Yes      Past Medical History:  Diagnosis Date   Arthritis    Asthma    Atrial fibrillation (HCC)    Chicken pox    Depression    Elevated WBC count 01/07/2022   Migraine    SVT (supraventricular tachycardia) (HCC)    Past Surgical History:  Procedure Laterality Date   BREAST BIOPSY  2020   CHOLECYSTECTOMY  2013   ENDOMETRIAL ABLATION  2013   KNEE ARTHROSCOPY Right 2018   meniscus   KNEE SURGERY Left 1992   "dislox"   SHOULDER ARTHROSCOPY  2018  Labral tear with anchors x3   VAGINAL DELIVERY     X3, 1996, 1999, 2002   Family History  Problem Relation Age of Onset   Skin cancer Mother    Asthma Mother    Osteoarthritis Mother    Thyroid disease Mother    Diabetes Mother    Stroke Mother    Atrial fibrillation Mother    Alcohol abuse Mother    Depression Mother    Hypertension Father    Osteoarthritis Father    Diabetes Father    Atrial fibrillation Father    Stroke Father    Hypertension Brother    Osteoarthritis Brother    Alcohol abuse Brother    Asthma Brother    Drug abuse Brother    Skin cancer Brother    Hypertension Maternal Grandmother    Diabetes Maternal Grandmother    Depression Maternal Grandmother    Osteoarthritis Maternal Grandmother    Miscarriages /  Stillbirths Maternal Grandmother    Bone cancer Maternal Grandfather        late 57s   Dementia Maternal Grandfather    Rheum arthritis Paternal Grandmother    Depression Paternal Grandmother    Migraines Paternal Grandmother    Heart attack Paternal Grandfather 16   Depression Daughter    Anxiety disorder Daughter    ADD / ADHD Daughter    ADD / ADHD Son    Hypothyroidism Son    ADD / ADHD Son    Angioedema Neg Hx    Allergic rhinitis Neg Hx    Immunodeficiency Neg Hx    Urticaria Neg Hx    Eczema Neg Hx    Allergies as of 05/18/2023       Reactions   Oxycodone Other (See Comments)   Severe headache Severe headache Severe headache   Tapentadol Rash        Medication List        Accurate as of May 18, 2023  1:15 PM. If you have any questions, ask your nurse or doctor.          STOP taking these medications    predniSONE 50 MG tablet Commonly known as: DELTASONE Stopped by: Felix Pacini       TAKE these medications    albuterol 108 (90 Base) MCG/ACT inhaler Commonly known as: VENTOLIN HFA Inhale 2 puffs into the lungs every 6 (six) hours as needed for wheezing or shortness of breath.   benzonatate 200 MG capsule Commonly known as: TESSALON Take 1 capsule (200 mg total) by mouth 2 (two) times daily as needed for cough. Started by: Felix Pacini   cyclobenzaprine 5 MG tablet Commonly known as: FLEXERIL Take 1 tablet (5 mg total) by mouth at bedtime as needed for muscle spasms.   doxycycline 100 MG tablet Commonly known as: VIBRA-TABS Take 1 tablet (100 mg total) by mouth 2 (two) times daily. Started by: Felix Pacini   EPINEPHrine 0.3 mg/0.3 mL Soaj injection Commonly known as: Auvi-Q Inject 0.3 mg into the muscle as needed for anaphylaxis.   famotidine 40 MG tablet Commonly known as: Pepcid Take 1 tablet (40 mg total) by mouth daily.   Fish Oil 1000 MG Caps Take by mouth.   Klor-Con 20 MEQ packet Generic drug: potassium chloride Take  20 mEq by mouth daily.   levocetirizine 5 MG tablet Commonly known as: XYZAL Take 1 tablet (5 mg total) by mouth daily.   MAGNESIUM GLYCINATE PO Take by mouth.   meloxicam 15 MG tablet  Commonly known as: MOBIC Take 15 mg by mouth daily.   montelukast 10 MG tablet Commonly known as: SINGULAIR Take 1 tablet (10 mg total) by mouth at bedtime.   RyVent 6 MG Tabs Generic drug: Carbinoxamine Maleate Take 1 tablet (6 mg) by mouth 2 (two) times daily as needed. Oral for 30 Days   Symbicort 160-4.5 MCG/ACT inhaler Generic drug: budesonide-formoterol Inhale 2 puffs into the lungs 2 (two) times daily. Inhalation for 30 Days   triamcinolone ointment 0.5 % Commonly known as: KENALOG Apply 1 Application topically 2 (two) times daily as needed.        All past medical history, surgical history, allergies, family history, immunizations andmedications were updated in the EMR today and reviewed under the history and medication portions of their EMR.     Review of Systems  Constitutional:  Positive for malaise/fatigue. Negative for chills and fever.  HENT:  Positive for congestion, sinus pain and sore throat.   Eyes:  Negative for pain and discharge.  Respiratory:  Positive for cough, sputum production, shortness of breath and wheezing.   Gastrointestinal:  Negative for diarrhea, nausea and vomiting.  Skin:  Negative for rash.  Neurological:  Positive for headaches. Negative for dizziness.   Negative, with the exception of above mentioned in HPI  Objective:  There were no vitals taken for this visit. There is no height or weight on file to calculate BMI. Physical Exam Vitals and nursing note reviewed.  Constitutional:      General: She is not in acute distress.    Appearance: Normal appearance. She is not toxic-appearing.  HENT:     Head: Normocephalic and atraumatic.  Eyes:     General: No scleral icterus.       Right eye: No discharge.        Left eye: No discharge.      Conjunctiva/sclera: Conjunctivae normal.  Pulmonary:     Effort: Pulmonary effort is normal.     Comments: Cough present  Musculoskeletal:     Cervical back: Normal range of motion.  Skin:    Findings: No rash.  Neurological:     Mental Status: She is alert and oriented to person, place, and time. Mental status is at baseline.  Psychiatric:        Mood and Affect: Mood normal.        Behavior: Behavior normal.        Thought Content: Thought content normal.        Judgment: Judgment normal.    No results found. No results found. No results found for this or any previous visit (from the past 24 hour(s)).  Assessment/Plan: Brookelyn Bobinski is a 47 y.o. female present for OV for  Bacterial respiratory infection Rest, hydrate.  mucinex (DM if cough), nettie pot or nasal saline.  Doxycycline twice daily prescribed, take until completed. -Tessalon Perles for cough Continue albuterol inhaler 1 to 2 puffs every 4-6 hours as needed only. Continue Symbicort inhaler as instructed If cough present it can last up to 6-8 weeks.  F/U 2 weeks, in person, if not improved.    Reviewed expectations re: course of current medical issues. Discussed self-management of symptoms. Outlined signs and symptoms indicating need for more acute intervention. Patient verbalized understanding and all questions were answered. Patient received an After-Visit Summary.    No orders of the defined types were placed in this encounter.  Meds ordered this encounter  Medications   doxycycline (VIBRA-TABS) 100 MG tablet  Sig: Take 1 tablet (100 mg total) by mouth 2 (two) times daily.    Dispense:  20 tablet    Refill:  0   benzonatate (TESSALON) 200 MG capsule    Sig: Take 1 capsule (200 mg total) by mouth 2 (two) times daily as needed for cough.    Dispense:  20 capsule    Refill:  0   Referral Orders  No referral(s) requested today     Note is dictated utilizing voice recognition software.  Although note has been proof read prior to signing, occasional typographical errors still can be missed. If any questions arise, please do not hesitate to call for verification.   electronically signed by:  Felix Pacini, DO  [facility] - [place]

## 2023-05-18 NOTE — Patient Instructions (Signed)

## 2023-05-27 ENCOUNTER — Ambulatory Visit: Payer: 59 | Admitting: Allergy & Immunology

## 2023-05-31 NOTE — Progress Notes (Signed)
Caroline Gill [facility] [address] [place] [address] Phone: [phone]   Assessment and Plan:     1. Chronic bilateral low back pain without sciatica (Primary) 2. Neck pain 3. Somatic dysfunction of cervical region 4. Somatic dysfunction of thoracic region 5. Somatic dysfunction of lumbar region 6. Somatic dysfunction of pelvic region 7. Somatic dysfunction of rib region -Chronic with exacerbation, subsequent visit - Recurrence of multiple musculoskeletal concerns with most prominent being in neck -May continue Flexeril 5 to 10 mg as needed for muscle spasms - May continue meloxicam 15 mg daily as needed for pain relief.  Recommend limiting chronic NSAIDs to 1-2 doses weekly - Patient has received relief with OMT in the past.  Elects for repeat OMT today.  Tolerated well per note below. - Decision today to treat with OMT was based on Physical Exam   After verbal consent patient was treated with HVLA (high velocity low amplitude), ME (muscle energy), FPR (flex positional release), ST (soft tissue), PC/PD (Pelvic Compression/ Pelvic Decompression) techniques in cervical, rib, thoracic, lumbar, and pelvic areas. Patient tolerated the procedure well with improvement in symptoms.  Patient educated on potential side effects of soreness and recommended to rest, hydrate, and use Tylenol as needed for pain control.   Pertinent previous records reviewed include none  Follow Up: 4 weeks for reevaluation.  Could consider repeat OMT   Subjective:   I, Caroline Gill, am serving as a Neurosurgeon for Doctor Richardean Sale   Chief Complaint: neck pain    HPI:    07/15/2022 Patient is a 47 year old female complaining of neck pain. Patient states that she had a chiro adjustment last week and has pain and dizziness since, went to the ED a couple of day ago,  she feels unsteady on her feet when she moves her head. She does not seem to notice if turning her head in  any direction particular makes the unsteadiness worse. She is still having thoracic back discomfort.    08/05/2022 Patient states that she is better than it was , still has some dizziness, she was getting some catching when she does exercises , she feels like her atlas is out and she can hear heartbeat in her ear    09/02/2022 Patient states having an autoimmune flare, she states she is getting better gets injections tomorrow , tightness isnt as bad as it usually is    09/30/2022 Patient states that she Is pretty good , just here for an adjustment ,she has a bunion and would like any recommendations    10/28/2022 Patient states that she is hurting spent 14 hours sitting in the hospital  waiting area   11/25/2022 Patient states she is tired and in some pain , has been sleeping on an air mattress    12/23/2022 Patient states that she is feeling good, thoracic is flared she has been sitting at the computer    01/20/2023 Patient states that she is a little sore but overall pretty good    02/23/2023 Patient states threw her back out last sunday   04/13/2023 Patient states she is tight and has some hip pain but okay other than that    05/11/2023 Patient states that her neck and lumbar spine are painful.   06/01/2023 Patient states neck pain today   Relevant Historical Information: Weakly positive ANA, Arnold-Chiari malformation  Additional pertinent review of systems negative.  Current Outpatient Medications  Medication Sig Dispense Refill  albuterol (VENTOLIN HFA) 108 (90 Base) MCG/ACT inhaler Inhale 2 puffs into the lungs every 6 (six) hours as needed for wheezing or shortness of breath. 18 g 3   benzonatate (TESSALON) 200 MG capsule Take 1 capsule (200 mg total) by mouth 2 (two) times daily as needed for cough. 20 capsule 0   budesonide-formoterol (SYMBICORT) 160-4.5 MCG/ACT inhaler Inhale 2 puffs into the lungs 2 (two) times daily. Inhalation for 30 Days     Carbinoxamine Maleate  (RYVENT) 6 MG TABS Take 1 tablet (6 mg) by mouth 2 (two) times daily as needed. Oral for 30 Days     cyclobenzaprine (FLEXERIL) 5 MG tablet Take 1 tablet (5 mg total) by mouth at bedtime as needed for muscle spasms. 30 tablet 0   doxycycline (VIBRA-TABS) 100 MG tablet Take 1 tablet (100 mg total) by mouth 2 (two) times daily. 20 tablet 0   EPINEPHrine (AUVI-Q) 0.3 mg/0.3 mL IJ SOAJ injection Inject 0.3 mg into the muscle as needed for anaphylaxis. 1 each 1   KLOR-CON 20 MEQ packet Take 20 mEq by mouth daily.     levocetirizine (XYZAL) 5 MG tablet Take 1 tablet (5 mg total) by mouth daily. 90 tablet 1   MAGNESIUM GLYCINATE PO Take by mouth.     meloxicam (MOBIC) 15 MG tablet Take 15 mg by mouth daily.     montelukast (SINGULAIR) 10 MG tablet Take 1 tablet (10 mg total) by mouth at bedtime. 90 tablet 1   Omega-3 Fatty Acids (FISH OIL) 1000 MG CAPS Take by mouth.     triamcinolone ointment (KENALOG) 0.5 % Apply 1 Application topically 2 (two) times daily as needed. 30 g 5   famotidine (PEPCID) 40 MG tablet Take 1 tablet (40 mg total) by mouth daily. 90 tablet 1   No current facility-administered medications for this visit.      Objective:     Vitals:   06/01/23 0823  BP: 128/82  Weight: 247 lb (112 kg)  Height: 5\' 10"  (1.778 m)      Body mass index is 35.44 kg/m.    Physical Exam:     General: Well-appearing, cooperative, sitting comfortably in no acute distress.   OMT Physical Exam:  ASIS Compression Test: Positive left Cervical: TTP paraspinal, C3 RRSR Rib: Bilateral elevated first rib with TTP Thoracic: TTP paraspinal, T4-6 RRSL Lumbar: TTP paraspinal, L1-3 RLSR Pelvis: Left posterior innominate  Electronically signed by:  Caroline Gill [facility] 8:43 AM 06/01/23

## 2023-06-01 ENCOUNTER — Ambulatory Visit: Payer: 59 | Admitting: Sports Medicine

## 2023-06-01 VITALS — BP 128/82 | Ht 70.0 in | Wt 247.0 lb

## 2023-06-01 DIAGNOSIS — M9901 Segmental and somatic dysfunction of cervical region: Secondary | ICD-10-CM

## 2023-06-01 DIAGNOSIS — M9902 Segmental and somatic dysfunction of thoracic region: Secondary | ICD-10-CM

## 2023-06-01 DIAGNOSIS — M9905 Segmental and somatic dysfunction of pelvic region: Secondary | ICD-10-CM

## 2023-06-01 DIAGNOSIS — M9903 Segmental and somatic dysfunction of lumbar region: Secondary | ICD-10-CM

## 2023-06-01 DIAGNOSIS — M542 Cervicalgia: Secondary | ICD-10-CM | POA: Diagnosis not present

## 2023-06-01 DIAGNOSIS — M9908 Segmental and somatic dysfunction of rib cage: Secondary | ICD-10-CM

## 2023-06-01 DIAGNOSIS — G8929 Other chronic pain: Secondary | ICD-10-CM

## 2023-06-01 DIAGNOSIS — M545 Low back pain, unspecified: Secondary | ICD-10-CM

## 2023-06-03 ENCOUNTER — Encounter: Payer: Self-pay | Admitting: Family Medicine

## 2023-06-04 ENCOUNTER — Other Ambulatory Visit: Payer: Self-pay

## 2023-06-04 ENCOUNTER — Encounter: Payer: Self-pay | Admitting: Family Medicine

## 2023-06-04 ENCOUNTER — Ambulatory Visit (INDEPENDENT_AMBULATORY_CARE_PROVIDER_SITE_OTHER): Payer: 59 | Admitting: Family Medicine

## 2023-06-04 VITALS — BP 120/78 | HR 83 | Temp 98.6°F | Wt 250.8 lb

## 2023-06-04 DIAGNOSIS — J4521 Mild intermittent asthma with (acute) exacerbation: Secondary | ICD-10-CM | POA: Diagnosis not present

## 2023-06-04 MED ORDER — IPRATROPIUM BROMIDE 0.06 % NA SOLN: 2.0000 | Freq: Four times a day (QID) | NASAL | 12 refills | Status: DC

## 2023-06-04 MED ORDER — AZITHROMYCIN 250 MG PO TABS: ORAL_TABLET | ORAL | 0 refills | Status: AC

## 2023-06-04 MED ORDER — PREDNISONE 20 MG PO TABS: 20.0000 mg | ORAL_TABLET | Freq: Every day | ORAL | 0 refills | Status: AC

## 2023-06-04 MED ORDER — LEVOCETIRIZINE DIHYDROCHLORIDE 5 MG PO TABS: 5.0000 mg | ORAL_TABLET | Freq: Every day | ORAL | 0 refills | Status: DC

## 2023-06-04 NOTE — Telephone Encounter (Signed)
Courtesy refill of levocetirizine 5 mg was filled for 30 tablets.  Patient has an upcoming appointment in February 2025

## 2023-06-04 NOTE — Patient Instructions (Addendum)
Return in about 2 weeks (around 06/18/2023).  We will treat you for asthma exacerbation today. Cough can be persistent up to 6-8 weeks after infection.      Great to see you today.  I have refilled the medication(s) we provide.   If labs were collected or images ordered, we will inform you of  results once we have received them and reviewed. We will contact you either by echart message, or telephone call.  Please give ample time to the testing facility, and our office to run,  receive and review results. Please do not call inquiring of results, even if you can see them in your chart. We will contact you as soon as we are able. If it has been over 1 week since the test was completed, and you have not yet heard from [place], then please call us.    - echart message- for normal results that have been seen by the patient already.   - telephone call: abnormal results or if patient has not viewed results in their echart.  If a referral to a specialist was entered for you, please call us in 2 weeks if you have not heard from the specialist office to schedule.    Marland Kitchen

## 2023-06-04 NOTE — Progress Notes (Signed)
Caroline Gill , [birth year], 47 y.o., female MRN: [id number] Patient Care Team    Relationship Specialty Notifications Start End  Natalia Leatherwood, DO PCP - General Family Medicine  01/07/22   Aletha Halim, MD Consulting Physician Neurology  01/07/22   Ronda Fairly. Nicanor Bake, MD Consulting Physician Cardiology  01/07/22   Verlee Monte, MD Consulting Physician Allergy and Immunology  01/07/22   Edwyna Shell, MD Referring Physician Hematology and Oncology  01/07/22   Rolland Bimler T  Gastroenterology  01/07/22    Comment: Concepcion Living, MD Referring Physician Pulmonary Disease  01/07/22     Chief Complaint  Patient presents with   Cough    Since Thanksgiving; cough, head/chest congestion. Went through antibiotic and still seen no improvement.      Subjective: Caroline Gill is a 47 y.o. Pt presents for an OV with complaints of persistent cold-like symptoms.   Patient was seen virtually 12/3 with a history of chest congestion of 5 days duration.  Associated symptoms include cough, sinus pressure, chest tightness and sore throat.  She did take a COVID test that was negative for the weekend.. Pt has tried DayQuil, Mucinex, albuterol inhaler, Symbicort inhaler to ease their symptoms.  She has been out of hospital/rehab centers caring for her mom recently. She was treated with doxycycline twice daily x 7 days, Tessalon Perles, albuterol inhaler and Symbicort. Today she reports she is having persistent symptoms of cough, congestion and phlegm prdx. Denies fever, chills.  Feeling congestion in her chest.     04/08/2023    8:13 AM 04/06/2022    8:45 AM 01/07/2022   10:02 AM  Depression screen PHQ 2/9  Decreased Interest 1 0 3  Down, Depressed, Hopeless 1 0 1  PHQ - 2 Score 2 0 4  Altered sleeping  1   Tired, decreased energy  1   Change in appetite  0   Feeling bad or failure about yourself  3 1   Trouble concentrating 1 1   Moving slowly or fidgety/restless 0 0   Suicidal  thoughts 0 0   PHQ-9 Score  4   Difficult doing work/chores Somewhat difficult      Allergies  Allergen Reactions   Oxycodone Other (See Comments)    Severe headache Severe headache Severe headache   Tapentadol Rash   Social History   Social History Narrative   Marital status/children/pets: Married.  3 children.   Education/employment: College-educated.  Owner/CFO-employed   Safety:      -Wears a bicycle helmet riding a bike: Yes     -smoke alarm in the home:Yes     - wears seatbelt: Yes     - Feels safe in their relationships: Yes      Past Medical History:  Diagnosis Date   Arthritis    Asthma    Atrial fibrillation (HCC)    Chicken pox    Depression    Elevated WBC count 01/07/2022   Migraine    SVT (supraventricular tachycardia) (HCC)    Past Surgical History:  Procedure Laterality Date   BREAST BIOPSY  2020   CHOLECYSTECTOMY  2013   ENDOMETRIAL ABLATION  2013   KNEE ARTHROSCOPY Right 2018   meniscus   KNEE SURGERY Left 1992   "dislox"   SHOULDER ARTHROSCOPY  2018   Labral tear with anchors x3   VAGINAL DELIVERY     X3, 1996, 1999, 2002   Family History  Problem Relation  Age of Onset   Skin cancer Mother    Asthma Mother    Osteoarthritis Mother    Thyroid disease Mother    Diabetes Mother    Stroke Mother    Atrial fibrillation Mother    Alcohol abuse Mother    Depression Mother    Hypertension Father    Osteoarthritis Father    Diabetes Father    Atrial fibrillation Father    Stroke Father    Hypertension Brother    Osteoarthritis Brother    Alcohol abuse Brother    Asthma Brother    Drug abuse Brother    Skin cancer Brother    Hypertension Maternal Grandmother    Diabetes Maternal Grandmother    Depression Maternal Grandmother    Osteoarthritis Maternal Grandmother    Miscarriages / Stillbirths Maternal Grandmother    Bone cancer Maternal Grandfather        late 34s   Dementia Maternal Grandfather    Rheum arthritis Paternal  Grandmother    Depression Paternal Grandmother    Migraines Paternal Grandmother    Heart attack Paternal Grandfather 43   Depression Daughter    Anxiety disorder Daughter    ADD / ADHD Daughter    ADD / ADHD Son    Hypothyroidism Son    ADD / ADHD Son    Angioedema Neg Hx    Allergic rhinitis Neg Hx    Immunodeficiency Neg Hx    Urticaria Neg Hx    Eczema Neg Hx    Allergies as of 06/04/2023       Reactions   Oxycodone Other (See Comments)   Severe headache Severe headache Severe headache   Tapentadol Rash        Medication List        Accurate as of June 04, 2023  8:55 AM. If you have any questions, ask your nurse or doctor.          STOP taking these medications    benzonatate 200 MG capsule Commonly known as: TESSALON Stopped by: Felix Pacini   doxycycline 100 MG tablet Commonly known as: VIBRA-TABS Stopped by: Felix Pacini       TAKE these medications    albuterol 108 (90 Base) MCG/ACT inhaler Commonly known as: VENTOLIN HFA Inhale 2 puffs into the lungs every 6 (six) hours as needed for wheezing or shortness of breath.   azithromycin 250 MG tablet Commonly known as: ZITHROMAX Take 2 tablets on day 1, then 1 tablet daily on days 2 through 5 Started by: Felix Pacini   cyclobenzaprine 5 MG tablet Commonly known as: FLEXERIL Take 1 tablet (5 mg total) by mouth at bedtime as needed for muscle spasms.   EPINEPHrine 0.3 mg/0.3 mL Soaj injection Commonly known as: Auvi-Q Inject 0.3 mg into the muscle as needed for anaphylaxis.   famotidine 40 MG tablet Commonly known as: Pepcid Take 1 tablet (40 mg total) by mouth daily.   Fish Oil 1000 MG Caps Take by mouth.   ipratropium 0.06 % nasal spray Commonly known as: ATROVENT Place 2 sprays into both nostrils 4 (four) times daily. Started by: Felix Pacini   Klor-Con 20 MEQ packet Generic drug: potassium chloride Take 20 mEq by mouth daily.   levocetirizine 5 MG tablet Commonly known  as: XYZAL Take 1 tablet (5 mg total) by mouth daily.   MAGNESIUM GLYCINATE PO Take by mouth.   meloxicam 15 MG tablet Commonly known as: MOBIC Take 15 mg by mouth daily.  montelukast 10 MG tablet Commonly known as: SINGULAIR Take 1 tablet (10 mg total) by mouth at bedtime.   predniSONE 20 MG tablet Commonly known as: DELTASONE Take 1 tablet (20 mg total) by mouth daily with breakfast for 5 days. Started by: Felix Pacini   RyVent 6 MG Tabs Generic drug: Carbinoxamine Maleate Take 1 tablet (6 mg) by mouth 2 (two) times daily as needed. Oral for 30 Days   Symbicort 160-4.5 MCG/ACT inhaler Generic drug: budesonide-formoterol Inhale 2 puffs into the lungs 2 (two) times daily. Inhalation for 30 Days   triamcinolone ointment 0.5 % Commonly known as: KENALOG Apply 1 Application topically 2 (two) times daily as needed.        All past medical history, surgical history, allergies, family history, immunizations andmedications were updated in the EMR today and reviewed under the history and medication portions of their EMR.     ROS Negative, with the exception of above mentioned in HPI  Objective:  BP 120/78   Pulse 83   Temp 98.6 F (37 C)   Wt 250 lb 12.8 oz (113.8 kg)   SpO2 98%   BMI 35.99 kg/m  Body mass index is 35.99 kg/m. Physical Exam Vitals and nursing note reviewed.  Constitutional:      General: She is not in acute distress.    Appearance: Normal appearance. She is not ill-appearing, toxic-appearing or diaphoretic.  HENT:     Head: Normocephalic and atraumatic.     Right Ear: Tympanic membrane and ear canal normal. There is no impacted cerumen.     Left Ear: Tympanic membrane and ear canal normal. There is no impacted cerumen.     Nose: Congestion and rhinorrhea present.     Comments: Postnasal drip present    Mouth/Throat:     Mouth: Mucous membranes are moist.     Pharynx: No oropharyngeal exudate or posterior oropharyngeal erythema.  Eyes:      General: No scleral icterus.       Right eye: No discharge.        Left eye: No discharge.     Extraocular Movements: Extraocular movements intact.     Conjunctiva/sclera: Conjunctivae normal.     Pupils: Pupils are equal, round, and reactive to light.  Cardiovascular:     Rate and Rhythm: Normal rate and regular rhythm.  Pulmonary:     Effort: Pulmonary effort is normal. No respiratory distress.     Breath sounds: Wheezing and rhonchi present. No rales.  Musculoskeletal:     Cervical back: Neck supple.     Right lower leg: No edema.     Left lower leg: No edema.  Lymphadenopathy:     Cervical: No cervical adenopathy.  Skin:    General: Skin is warm.     Findings: No rash.  Neurological:     Mental Status: She is alert and oriented to person, place, and time. Mental status is at baseline.     Motor: No weakness.     Gait: Gait normal.  Psychiatric:        Mood and Affect: Mood normal.        Behavior: Behavior normal.        Thought Content: Thought content normal.        Judgment: Judgment normal.    No results found. No results found. No results found for this or any previous visit (from the past 24 hours).  Assessment/Plan: Caroline Gill is a 47 y.o. female present for OV for  Mild intermittent asthmatic bronchitis with acute exacerbation (Primary) Rest, hydrate.  mucinex (DM if cough), nettie pot or nasal saline.  Azith, low dose prednisone Prescribed, take until completed. Continue albuterol inhaler 1 to 2 puffs every 4-6 hours as needed only. Continue Symbicort inhaler as instructed Atrovent nasal spray QID prn If cough present it can last up to 6-8 weeks.     Reviewed expectations re: course of current medical issues. Discussed self-management of symptoms. Outlined signs and symptoms indicating need for more acute intervention. Patient verbalized understanding and all questions were answered. Patient received an After-Visit Summary.  Return in about 2  weeks (around 06/18/2023).   No orders of the defined types were placed in this encounter.  Meds ordered this encounter  Medications   predniSONE (DELTASONE) 20 MG tablet    Sig: Take 1 tablet (20 mg total) by mouth daily with breakfast for 5 days.    Dispense:  5 tablet    Refill:  0   azithromycin (ZITHROMAX) 250 MG tablet    Sig: Take 2 tablets on day 1, then 1 tablet daily on days 2 through 5    Dispense:  6 tablet    Refill:  0   ipratropium (ATROVENT) 0.06 % nasal spray    Sig: Place 2 sprays into both nostrils 4 (four) times daily.    Dispense:  15 mL    Refill:  12   Referral Orders  No referral(s) requested today     Note is dictated utilizing voice recognition software. Although note has been proof read prior to signing, occasional typographical errors still can be missed. If any questions arise, please do not hesitate to call for verification.   electronically signed by:  Felix Pacini, DO  [facility] [place]

## 2023-06-07 ENCOUNTER — Encounter: Payer: Self-pay | Admitting: Allergy & Immunology

## 2023-06-07 ENCOUNTER — Other Ambulatory Visit: Payer: Self-pay

## 2023-06-07 MED ORDER — SYMBICORT 160-4.5 MCG/ACT IN AERO: 2.0000 | INHALATION_SPRAY | Freq: Two times a day (BID) | RESPIRATORY_TRACT | 0 refills | Status: DC

## 2023-06-14 ENCOUNTER — Encounter: Payer: Self-pay | Admitting: Family Medicine

## 2023-06-15 ENCOUNTER — Ambulatory Visit: Payer: 59 | Admitting: Allergy & Immunology

## 2023-06-28 NOTE — Progress Notes (Deleted)
 [facility] [address] [place] [address] Phone: [phone]   Assessment and Plan:     There are no diagnoses linked to this encounter.  *** - Patient has received relief with OMT in the past.  Elects for repeat OMT today.  Tolerated well per note below. - Decision today to treat with OMT was based on Physical Exam   After verbal consent patient was treated with HVLA (high velocity low amplitude), ME (muscle energy), FPR (flex positional release), ST (soft tissue), PC/PD (Pelvic Compression/ Pelvic Decompression) techniques in cervical, rib, thoracic, lumbar, and pelvic areas. Patient tolerated the procedure well with improvement in symptoms.  Patient educated on potential side effects of soreness and recommended to rest, hydrate, and use Tylenol as needed for pain control.   Pertinent previous records reviewed include ***    Follow Up: ***     Subjective:   I, Nari Vannatter, am serving as a neurosurgeon for Doctor Morene Mace   Chief Complaint: neck pain    HPI:    07/15/2022 Patient is a 48 year old female complaining of neck pain. Patient states that she had a chiro adjustment last week and has pain and dizziness since, went to the ED a couple of day ago,  she feels unsteady on her feet when she moves her head. She does not seem to notice if turning her head in any direction particular makes the unsteadiness worse. She is still having thoracic back discomfort.    08/05/2022 Patient states that she is better than it was , still has some dizziness, she was getting some catching when she does exercises , she feels like her atlas is out and she can hear heartbeat in her ear    09/02/2022 Patient states having an autoimmune flare, she states she is getting better gets injections tomorrow , tightness isnt as bad as it usually is    09/30/2022 Patient states that she Is pretty good , just here for an adjustment ,she has a bunion and would  like any recommendations    10/28/2022 Patient states that she is hurting spent 14 hours sitting in the hospital  waiting area   11/25/2022 Patient states she is tired and in some pain , has been sleeping on an air mattress    12/23/2022 Patient states that she is feeling good, thoracic is flared she has been sitting at the computer    01/20/2023 Patient states that she is a little sore but overall pretty good    02/23/2023 Patient states threw her back out last sunday   04/13/2023 Patient states she is tight and has some hip pain but okay other than that    05/11/2023 Patient states that her neck and lumbar spine are painful.    06/01/2023 Patient states neck pain today  06/29/2023 Patient states   Relevant Historical Information: Weakly positive ANA, Arnold-Chiari malformation  Additional pertinent review of systems negative.  Current Outpatient Medications  Medication Sig Dispense Refill   albuterol  (VENTOLIN  HFA) 108 (90 Base) MCG/ACT inhaler Inhale 2 puffs into the lungs every 6 (six) hours as needed for wheezing or shortness of breath. 18 g 3   Carbinoxamine  Maleate (RYVENT ) 6 MG TABS Take 1 tablet (6 mg) by mouth 2 (two) times daily as needed. Oral for 30 Days     cyclobenzaprine  (FLEXERIL ) 5 MG tablet Take 1 tablet (5 mg total) by mouth at bedtime as needed for muscle spasms. 30 tablet 0  EPINEPHrine  (AUVI-Q ) 0.3 mg/0.3 mL IJ SOAJ injection Inject 0.3 mg into the muscle as needed for anaphylaxis. 1 each 1   famotidine  (PEPCID ) 40 MG tablet Take 1 tablet (40 mg total) by mouth daily. 90 tablet 1   ipratropium (ATROVENT ) 0.06 % nasal spray Place 2 sprays into both nostrils 4 (four) times daily. 15 mL 12   KLOR-CON 20 MEQ packet Take 20 mEq by mouth daily.     levocetirizine (XYZAL ) 5 MG tablet Take 1 tablet (5 mg total) by mouth daily. 30 tablet 0   MAGNESIUM GLYCINATE PO Take by mouth.     meloxicam  (MOBIC ) 15 MG tablet Take 15 mg by mouth daily.     montelukast   (SINGULAIR ) 10 MG tablet Take 1 tablet (10 mg total) by mouth at bedtime. 90 tablet 1   Omega-3 Fatty Acids (FISH OIL) 1000 MG CAPS Take by mouth.     SYMBICORT  160-4.5 MCG/ACT inhaler Inhale 2 puffs into the lungs 2 (two) times daily. 1 each 0   triamcinolone  ointment (KENALOG ) 0.5 % Apply 1 Application topically 2 (two) times daily as needed. 30 g 5   No current facility-administered medications for this visit.      Objective:     There were no vitals filed for this visit.    There is no height or weight on file to calculate BMI.    Physical Exam:     General: Well-appearing, cooperative, sitting comfortably in no acute distress.   OMT Physical Exam:  ASIS Compression Test: Positive Right Cervical: TTP paraspinal, *** Rib: Bilateral elevated first rib with TTP Thoracic: TTP paraspinal,*** Lumbar: TTP paraspinal,*** Pelvis: Right anterior innominate  Electronically signed by:  Odis Mace D.[facility] 3:01 PM 06/28/23

## 2023-06-29 ENCOUNTER — Ambulatory Visit: Payer: 59 | Admitting: Sports Medicine

## 2023-07-01 ENCOUNTER — Ambulatory Visit (INDEPENDENT_AMBULATORY_CARE_PROVIDER_SITE_OTHER): Payer: 59 | Admitting: Allergy & Immunology

## 2023-07-01 ENCOUNTER — Encounter: Payer: Self-pay | Admitting: Allergy & Immunology

## 2023-07-01 ENCOUNTER — Other Ambulatory Visit: Payer: Self-pay

## 2023-07-01 VITALS — BP 118/74 | HR 86 | Temp 98.2°F | Resp 18 | Ht 70.08 in | Wt 249.7 lb

## 2023-07-01 DIAGNOSIS — T781XXD Other adverse food reactions, not elsewhere classified, subsequent encounter: Secondary | ICD-10-CM

## 2023-07-01 DIAGNOSIS — J3089 Other allergic rhinitis: Secondary | ICD-10-CM | POA: Diagnosis not present

## 2023-07-01 DIAGNOSIS — R7982 Elevated C-reactive protein (CRP): Secondary | ICD-10-CM

## 2023-07-01 DIAGNOSIS — J4520 Mild intermittent asthma, uncomplicated: Secondary | ICD-10-CM

## 2023-07-01 DIAGNOSIS — B999 Unspecified infectious disease: Secondary | ICD-10-CM

## 2023-07-01 DIAGNOSIS — J302 Other seasonal allergic rhinitis: Secondary | ICD-10-CM

## 2023-07-01 DIAGNOSIS — L239 Allergic contact dermatitis, unspecified cause: Secondary | ICD-10-CM

## 2023-07-01 MED ORDER — EPINEPHRINE 0.3 MG/0.3ML IJ SOAJ: 0.3000 mg | INTRAMUSCULAR | 1 refills | Status: AC | PRN

## 2023-07-01 MED ORDER — FAMOTIDINE 40 MG PO TABS: 40.0000 mg | ORAL_TABLET | Freq: Every day | ORAL | 1 refills | Status: DC

## 2023-07-01 MED ORDER — ALBUTEROL SULFATE HFA 108 (90 BASE) MCG/ACT IN AERS: 2.0000 | INHALATION_SPRAY | Freq: Four times a day (QID) | RESPIRATORY_TRACT | 3 refills | Status: DC | PRN

## 2023-07-01 MED ORDER — LEVOCETIRIZINE DIHYDROCHLORIDE 5 MG PO TABS: 5.0000 mg | ORAL_TABLET | Freq: Every day | ORAL | 1 refills | Status: DC

## 2023-07-01 MED ORDER — SYMBICORT 160-4.5 MCG/ACT IN AERO: 2.0000 | INHALATION_SPRAY | Freq: Two times a day (BID) | RESPIRATORY_TRACT | 5 refills | Status: AC

## 2023-07-01 MED ORDER — MONTELUKAST SODIUM 10 MG PO TABS: 10.0000 mg | ORAL_TABLET | Freq: Every day | ORAL | 1 refills | Status: DC

## 2023-07-01 MED ORDER — SYMBICORT 160-4.5 MCG/ACT IN AERO: 2.0000 | INHALATION_SPRAY | Freq: Two times a day (BID) | RESPIRATORY_TRACT | 0 refills | Status: DC

## 2023-07-01 MED ORDER — ALBUTEROL SULFATE HFA 108 (90 BASE) MCG/ACT IN AERS: 2.0000 | INHALATION_SPRAY | Freq: Four times a day (QID) | RESPIRATORY_TRACT | 2 refills | Status: AC | PRN

## 2023-07-01 NOTE — Patient Instructions (Addendum)
Adverse food reaction - stable - Continue with your same diet.    2. Elevated CRP - We are rechecking your CRP today. - We are also looking into your immune system given your multiple antibiotic courses.   3. Seasonal and perennial allergic rhinitis (grasses, ragweed, weeds, trees and dog) - Continue with: Xyzal (levocetirizine) 5mg  tablet 1-2 times daily and Singulair (montelukast) 10mg  daily   4. Possible contact dermatitis (fragrance mix, paraben mix, gold, formaldehyde, Stress away essential oil) - Avoid triggers as tolerated.  - Continue to moisturize with Honest Lotion.  5. Mild persistent asthma, uncomplicated  - Lung testing looks great today.  - I think you have a good handle on your symptoms.  - Daily controller medication(s): Singulair 10mg  daily - Prior to physical activity: albuterol 2 puffs 10-15 minutes before physical activity. - Rescue medications: albuterol 4 puffs every 4-6 hours as needed - Changes during respiratory infections or worsening symptoms: Add on Symbicort 160/4.74mcg to 2 puffs twice daily for ONE TO TWO WEEKS. - Asthma control goals:  * Full participation in all desired activities (may need albuterol before activity) * Albuterol use two time or less a week on average (not counting use with activity) * Cough interfering with sleep two time or less a month * Oral steroids no more than once a year * No hospitalizations  6. Return in about 6 months (around 12/29/2023). You can have the follow up appointment with Dr. Dellis Anes or a Nurse Practicioner (our Nurse Practitioners are excellent and always have Physician oversight!).    Please inform us of any Emergency Department visits, hospitalizations, or changes in symptoms. Call us before going to the ED for breathing or allergy symptoms since we might be able to fit you in for a sick visit. Feel free to contact us anytime with any questions, problems, or concerns.  It was a pleasure to see you again  today!  Websites that have reliable patient information: 1. American Academy of Asthma, Allergy, and Immunology: www.aaaai.org 2. Food Allergy Research and Education (FARE): foodallergy.org 3. Mothers of Asthmatics: http://www.asthmacommunitynetwork.org 4. American College of Allergy, Asthma, and Immunology: www.acaai.org      "Like" [place] on Facebook and Instagram for our latest updates!      A healthy democracy works best when [employer] participate! Make sure you are registered to vote! If you have moved or changed any of your contact information, you will need to get this updated before voting! Scan the QR codes below to learn more!

## 2023-07-01 NOTE — Progress Notes (Signed)
FOLLOW UP  Date of Service/Encounter:  07/01/23   Assessment:   Allergic reaction - unclear trigger   Mild persistent asthma, uncomplicated - intermittently compliant with the regimen   Seasonal and perennial allergic rhinitis (grasses, weeds, ragweed, trees, dog) - was on allergen immunotherapy in the past with multiple large local reactions   Possible contact dermatitis (fragrance mix, paraben mix, gold, formaldehyde, Stress away essential oil)   Elevated CRP (followed by Dr. Sheliah Hatch with a negative workup)   Neutrophilia - followed by Hematology/Oncology (Dr. Neil Crouch)   Supraventricular tachycardia  Plan/Recommendations:   Adverse food reaction - stable - Continue with your same diet.    2. Elevated CRP - We are rechecking your CRP today. - We are also looking into your immune system given your multiple antibiotic courses.   3. Seasonal and perennial allergic rhinitis (grasses, ragweed, weeds, trees and dog) - Continue with: Xyzal (levocetirizine) 5mg  tablet 1-2 times daily and Singulair (montelukast) 10mg  daily   4. Possible contact dermatitis (fragrance mix, paraben mix, gold, formaldehyde, Stress away essential oil) - Avoid triggers as tolerated.  - Continue to moisturize with Honest Lotion.  5. Mild persistent asthma, uncomplicated  - Lung testing looks great today.  - I think you have a good handle on your symptoms.  - Daily controller medication(s): Singulair 10mg  daily - Prior to physical activity: albuterol 2 puffs 10-15 minutes before physical activity. - Rescue medications: albuterol 4 puffs every 4-6 hours as needed - Changes during respiratory infections or worsening symptoms: Add on Symbicort 160/4.41mcg to 2 puffs twice daily for ONE TO TWO WEEKS. - Asthma control goals:  * Full participation in all desired activities (may need albuterol before activity) * Albuterol use two time or less a week on average (not counting use with activity) *  Cough interfering with sleep two time or less a month * Oral steroids no more than once a year * No hospitalizations  6. Return in about 6 months (around 12/29/2023). You can have the follow up appointment with Dr. Dellis Anes or a Nurse Practicioner (our Nurse Practitioners are excellent and always have Physician oversight!).   Subjective:   Caroline Gill is a 48 y.o. female presenting today for follow up of  Chief Complaint  Patient presents with   Asthma    No issues since November/December    Allergic Rhinitis     No change in symptoms - runny nose and sneezing     Caroline Gill has a history of the following: Patient Active Problem List   Diagnosis Date Noted   BMI 35-39.9, no comorbidity 04/08/2023   Positive ANA (antinuclear antibody) 03/18/2022   CRP elevated 03/18/2022   Polyarthralgia 01/23/2022   Leg cramps 01/07/2022   Allergic reaction 03/04/2020   Extrinsic asthma 03/04/2020   Seasonal and perennial allergic rhinitis 03/04/2020   Allergic contact dermatitis 03/04/2020   Family history of hypercoagulability 03/03/2013    History obtained from: chart review and patient.  Discussed the use of AI scribe software for clinical note transcription with the patient and/or guardian, who gave verbal consent to proceed.  Caroline Gill is a 48 y.o. female presenting for a follow up visit. She was last seen in June 2024. At that time, we continued to trend her CRP. For her rhinitis, we continue with Xyzal 5 mg 1-2 times daily and Singulair.  She continue to avoid all of her triggering chemicals.  Her lung testing looked great.  We recommended taking the Symbicort at least nightly.  We also added on Pepcid 40 mg at night since we conjecture that uncontrolled GERD was contributing to her shortness of breath.  Since last visit, she has done fairly well.  Caroline Gill presents with a recent prolonged illness of unknown etiology. She reports having been unwell since Thanksgiving, with  symptoms persisting until after Christmas. The illness was characterized by respiratory symptoms, for which she increased the use of her inhalers. During this period, she also experienced a significant exacerbation of her allergies.  In total, she required 3 courses of antibiotics and a couple courses of prednisone to clear this.  We have continued to follow her for an elevated CRP, the cause of which remains undetermined. The patient expresses frustration with the lack of clarity regarding this finding. She also reports a history of frequent infections, requiring multiple courses of antibiotics.   Asthma/Respiratory Symptom History: Her asthma is generally well-controlled, with inhalers used as needed. However, during periods of illness, the patient reports that her symptoms worsen significantly, necessitating increased use of her inhalers. She is adherent to her Xyzal and Singulair regimen and takes these every day throughout the year.  Allergic Rhinitis Symptom History: She is adherent to her Xyzal and Singulair regimen and takes these every day throughout the year.  She has had multiple rounds of antibiotics as described above.  She generally does not require a lot of antibiotics, so this changes different for her.  She did get recurrent strep throat when she was much younger, but otherwise has been stable.  Skin Symptom History: Caroline Gill's skin condition appears to be well-managed, with no new triggers identified. She has been using Honest Lotion for approximately four months, which seems to be effective.  She continues to avoid all of her triggering chemicals.   She continues to experience chronic pain and dietary sensitivities, particularly to sweet foods, bread, and pasta. Consumption of these foods reportedly exacerbates her pain. She previously found relief with a carnivore diet, but has since discontinued it.  She has gained some weight since stopping the carnivore diet.  [name] and her for  family unfortunately have been dealing with significant external stressors, including the illness and subsequent rehabilitation of her mother. This has added to her overall burden of illness.  Otherwise, there have been no changes to her past medical history, surgical history, family history, or social history.    Review of systems otherwise negative other than that mentioned in the HPI.  Objective:   Blood pressure 118/74, pulse 86, temperature 98.2 F (36.8 C), temperature source Temporal, resp. rate 18, height 5' 10.08" (1.78 m), weight 249 lb 11.2 oz (113.3 kg), SpO2 95%. Body mass index is 35.75 kg/m.    Physical Exam Vitals reviewed.  Constitutional:      Appearance: Normal appearance. She is well-developed.     Comments: Delightful.  Talkative.  HENT:     Head: Normocephalic and atraumatic.     Right Ear: Tympanic membrane, ear canal and external ear normal.     Left Ear: Tympanic membrane, ear canal and external ear normal.     Nose: No nasal deformity, septal deviation, mucosal edema or rhinorrhea.     Right Turbinates: Enlarged, swollen and pale.     Left Turbinates: Enlarged, swollen and pale.     Right Sinus: No maxillary sinus tenderness or frontal sinus tenderness.     Left Sinus: No maxillary sinus tenderness or frontal sinus tenderness.     Comments: No nasal polyps noted.  Turbinates erythematous.  No epistaxis.    Mouth/Throat:     Lips: Pink.     Mouth: Mucous membranes are not pale and not dry.     Pharynx: Uvula midline.  Eyes:     General: Lids are normal. No allergic shiner.       Right eye: No discharge.        Left eye: No discharge.     Conjunctiva/sclera: Conjunctivae normal.     Right eye: Right conjunctiva is not injected. No chemosis.    Left eye: Left conjunctiva is not injected. No chemosis.    Pupils: Pupils are equal, round, and reactive to light.  Cardiovascular:     Rate and Rhythm: Normal rate and regular rhythm.     Heart sounds:  Normal heart sounds.  Pulmonary:     Effort: Pulmonary effort is normal. No tachypnea, accessory muscle usage or respiratory distress.     Breath sounds: Normal breath sounds. No wheezing, rhonchi or rales.     Comments: Moving air well in all lung fields.  No increased work of breathing. Chest:     Chest wall: No tenderness.  Lymphadenopathy:     Cervical: No cervical adenopathy.  Skin:    General: Skin is warm.     Capillary Refill: Capillary refill takes less than 2 seconds.     Coloration: Skin is not pale.     Findings: No abrasion, erythema, petechiae or rash. Rash is not papular, urticarial or vesicular.     Comments: Faint erythema, but overall fairly clear skin.  She does have an both of her hands and arms.  Neurological:     Mental Status: She is alert.  Psychiatric:        Behavior: Behavior is cooperative.      Diagnostic studies:    Spirometry: results normal (FEV1: 2.93/87%, FVC: 2.93/87%, FEV1/FVC: 81%).    Spirometry consistent with normal pattern.   Allergy Studies: labs sent instead      Malachi Bonds, MD  [facility]

## 2023-07-02 ENCOUNTER — Telehealth: Payer: Self-pay

## 2023-07-02 LAB — C-REACTIVE PROTEIN: CRP: 7 mg/L (ref 0–10)

## 2023-07-02 NOTE — Telephone Encounter (Signed)
It does not appear she has tried otc pepcid just rx since 11/2022

## 2023-07-02 NOTE — Telephone Encounter (Signed)
Submitted with this additional information

## 2023-07-02 NOTE — Telephone Encounter (Signed)
*  Asthma/Allergy  Pharmacy Patient Advocate Encounter   Received notification from CoverMyMeds that prior authorization for Famotidine 40MG  tablets  is required/requested.   Insurance verification completed.   The patient is insured through [facility] .   Per test claim: PA required; PA started via CoverMyMeds. KEY [id number] . Please see clinical question(s) below that I am not finding the answer to in her chart and advise.   Does the patient have a history of failure, contraindication or intolerance to OTC Pepcid AC (famotidine)?

## 2023-07-04 LAB — ANTINUCLEAR ANTIBODIES, IFA: ANA Titer 1: NEGATIVE

## 2023-07-05 NOTE — Telephone Encounter (Addendum)
Received fax from [facility] - PA DENIAL - Famotidine 40 mg tablet.  Per PA:  The medicine is covered only if patient has failed or cannot use over - the - counter Pepcid AC (Famotidine).  Called patient - DOB/NEED DPR  - LMOVM to contact office regarding the above notation.  If/When patient call back - please advise of above notation - verify/confirm whether or not she has ever tried OTC Pepcid AC (Famotidine).

## 2023-07-05 NOTE — Progress Notes (Unsigned)
Aleen Sells [name] [address] [place] [address] Phone: [phone]   Assessment and Plan:     There are no diagnoses linked to this encounter.  *** - Patient has received relief with OMT in the past.  Elects for repeat OMT today.  Tolerated well per note below. - Decision today to treat with OMT was based on Physical Exam   After verbal consent patient was treated with HVLA (high velocity low amplitude), ME (muscle energy), FPR (flex positional release), ST (soft tissue), PC/PD (Pelvic Compression/ Pelvic Decompression) techniques in cervical, rib, thoracic, lumbar, and pelvic areas. Patient tolerated the procedure well with improvement in symptoms.  Patient educated on potential side effects of soreness and recommended to rest, hydrate, and use Tylenol as needed for pain control.   Pertinent previous records reviewed include ***    Follow Up: ***     Subjective:   I, Vester Balthazor, am serving as a Neurosurgeon for Doctor Richardean Sale   Chief Complaint: neck pain    HPI:    07/15/2022 Patient is a 48 year old female complaining of neck pain. Patient states that she had a chiro adjustment last week and has pain and dizziness since, went to the ED a couple of day ago,  she feels unsteady on her feet when she moves her head. She does not seem to notice if turning her head in any direction particular makes the unsteadiness worse. She is still having thoracic back discomfort.    08/05/2022 Patient states that she is better than it was , still has some dizziness, she was getting some catching when she does exercises , she feels like her atlas is out and she can hear heartbeat in her ear    09/02/2022 Patient states having an autoimmune flare, she states she is getting better gets injections tomorrow , tightness isnt as bad as it usually is    09/30/2022 Patient states that she Is pretty good , just here for an adjustment ,she has a bunion and would  like any recommendations    10/28/2022 Patient states that she is hurting spent 14 hours sitting in the hospital  waiting area   11/25/2022 Patient states she is tired and in some pain , has been sleeping on an air mattress    12/23/2022 Patient states that she is feeling good, thoracic is flared she has been sitting at the computer    01/20/2023 Patient states that she is a little sore but overall pretty good    02/23/2023 Patient states threw her back out last sunday   04/13/2023 Patient states she is tight and has some hip pain but okay other than that    05/11/2023 Patient states that her neck and lumbar spine are painful.    06/01/2023 Patient states neck pain today  07/06/2023 Patient states   Relevant Historical Information: Weakly positive ANA, Arnold-Chiari malformation  Additional pertinent review of systems negative.  Current Outpatient Medications  Medication Sig Dispense Refill   albuterol (VENTOLIN HFA) 108 (90 Base) MCG/ACT inhaler Inhale 2 puffs into the lungs every 6 (six) hours as needed for wheezing or shortness of breath. 18 g 2   Carbinoxamine Maleate (RYVENT) 6 MG TABS Take 1 tablet (6 mg) by mouth 2 (two) times daily as needed. Oral for 30 Days     cyclobenzaprine (FLEXERIL) 5 MG tablet Take 1 tablet (5 mg total) by mouth at bedtime as needed for muscle spasms. 30 tablet 0  EPINEPHrine (AUVI-Q) 0.3 mg/0.3 mL IJ SOAJ injection Inject 0.3 mg into the muscle as needed for anaphylaxis. 1 each 1   famotidine (PEPCID) 40 MG tablet Take 1 tablet (40 mg total) by mouth daily. 90 tablet 1   KLOR-CON 20 MEQ packet Take 20 mEq by mouth daily.     levocetirizine (XYZAL) 5 MG tablet Take 1 tablet (5 mg total) by mouth daily. 90 tablet 1   MAGNESIUM GLYCINATE PO Take by mouth.     meloxicam (MOBIC) 15 MG tablet Take 15 mg by mouth daily.     montelukast (SINGULAIR) 10 MG tablet Take 1 tablet (10 mg total) by mouth at bedtime. 90 tablet 1   Omega-3 Fatty Acids (FISH  OIL) 1000 MG CAPS Take by mouth.     SYMBICORT 160-4.5 MCG/ACT inhaler Inhale 2 puffs into the lungs 2 (two) times daily. 10.2 g 5   triamcinolone ointment (KENALOG) 0.5 % Apply 1 Application topically 2 (two) times daily as needed. 30 g 5   VALACYCLOVIR HCL PO Take 1 tablet by mouth 2 (two) times daily.     No current facility-administered medications for this visit.      Objective:     There were no vitals filed for this visit.    There is no height or weight on file to calculate BMI.    Physical Exam:     General: Well-appearing, cooperative, sitting comfortably in no acute distress.   OMT Physical Exam:  ASIS Compression Test: Positive Right Cervical: TTP paraspinal, *** Rib: Bilateral elevated first rib with TTP Thoracic: TTP paraspinal,*** Lumbar: TTP paraspinal,*** Pelvis: Right anterior innominate  Electronically signed by:  Aleen Sells [name] 7:26 AM 07/05/23

## 2023-07-06 ENCOUNTER — Ambulatory Visit (INDEPENDENT_AMBULATORY_CARE_PROVIDER_SITE_OTHER): Payer: 59 | Admitting: Sports Medicine

## 2023-07-06 VITALS — BP 120/72 | HR 79 | Ht 70.0 in | Wt 245.0 lb

## 2023-07-06 DIAGNOSIS — G8929 Other chronic pain: Secondary | ICD-10-CM

## 2023-07-06 DIAGNOSIS — M9908 Segmental and somatic dysfunction of rib cage: Secondary | ICD-10-CM

## 2023-07-06 DIAGNOSIS — M9902 Segmental and somatic dysfunction of thoracic region: Secondary | ICD-10-CM

## 2023-07-06 DIAGNOSIS — M9905 Segmental and somatic dysfunction of pelvic region: Secondary | ICD-10-CM

## 2023-07-06 DIAGNOSIS — M545 Low back pain, unspecified: Secondary | ICD-10-CM | POA: Diagnosis not present

## 2023-07-06 DIAGNOSIS — M9901 Segmental and somatic dysfunction of cervical region: Secondary | ICD-10-CM

## 2023-07-06 DIAGNOSIS — M9903 Segmental and somatic dysfunction of lumbar region: Secondary | ICD-10-CM

## 2023-07-06 DIAGNOSIS — M542 Cervicalgia: Secondary | ICD-10-CM

## 2023-07-06 MED ORDER — FAMOTIDINE 20 MG PO TABS: 40.0000 mg | ORAL_TABLET | Freq: Every day | ORAL | 1 refills | Status: DC

## 2023-07-06 NOTE — Addendum Note (Signed)
Addended by: Alfonse Spruce on: 07/06/2023 01:14 PM   Modules accepted: Orders

## 2023-07-06 NOTE — Progress Notes (Signed)
Reviewed a denial and an insane amount of paperwork for the famotidine 40mg  daily. I am sending in 20mg  two tablets daily instead of filling out these extraneous forms.   Malachi Bonds, MD [facility]

## 2023-07-08 LAB — CBC WITH DIFF/PLATELET
Basophils Absolute: 0.1 10*3/uL (ref 0.0–0.2)
Basos: 1 %
EOS (ABSOLUTE): 0.4 10*3/uL (ref 0.0–0.4)
Eos: 3 %
Hematocrit: 40.7 % (ref 34.0–46.6)
Hemoglobin: 13.3 g/dL (ref 11.1–15.9)
Immature Grans (Abs): 0 10*3/uL (ref 0.0–0.1)
Immature Granulocytes: 0 %
Lymphocytes Absolute: 3.7 10*3/uL — ABNORMAL HIGH (ref 0.7–3.1)
Lymphs: 28 %
MCH: 29 pg (ref 26.6–33.0)
MCHC: 32.7 g/dL (ref 31.5–35.7)
MCV: 89 fL (ref 79–97)
Monocytes Absolute: 0.6 10*3/uL (ref 0.1–0.9)
Monocytes: 5 %
Neutrophils Absolute: 8.5 10*3/uL — ABNORMAL HIGH (ref 1.4–7.0)
Neutrophils: 63 %
Platelets: 292 10*3/uL (ref 150–450)
RBC: 4.58 x10E6/uL (ref 3.77–5.28)
RDW: 12.4 % (ref 11.7–15.4)
WBC: 13.2 10*3/uL — ABNORMAL HIGH (ref 3.4–10.8)

## 2023-07-08 LAB — STREP PNEUMONIAE 23 SEROTYPES IGG
Pneumo Ab Type 1*: 0.3 ug/mL — ABNORMAL LOW (ref >1.3)
Pneumo Ab Type 14*: 0.8 ug/mL — ABNORMAL LOW (ref >1.3)
Pneumo Ab Type 17 (17F)*: 2.8 ug/mL (ref >1.3)
Pneumo Ab Type 19 (19F)*: 2.1 ug/mL (ref >1.3)
Pneumo Ab Type 20*: 2.7 ug/mL (ref >1.3)
Pneumo Ab Type 23 (23F)*: 0.1 ug/mL — ABNORMAL LOW (ref >1.3)
Pneumo Ab Type 26 (6B)*: 0.3 ug/mL — ABNORMAL LOW (ref >1.3)
Pneumo Ab Type 54 (15B)*: 2.7 ug/mL (ref >1.3)
Pneumo Ab Type 56 (18C)*: 0.3 ug/mL — ABNORMAL LOW (ref >1.3)
Pneumo Ab Type 57 (19A)*: 3.1 ug/mL (ref >1.3)
Pneumo Ab Type 68 (9V)*: 0.1 ug/mL — ABNORMAL LOW (ref >1.3)
Pneumo Ab Type 70 (33F)*: 0.3 ug/mL — ABNORMAL LOW (ref >1.3)

## 2023-07-08 LAB — DIPHTHERIA / TETANUS ANTIBODY PANEL
Diphtheria Ab: 1.04 [IU]/mL (ref <0.10)
Tetanus Ab, IgG: 1.63 [IU]/mL (ref <0.10)

## 2023-07-08 LAB — IGG, IGA, IGM
IgA/Immunoglobulin A, Serum: 305 mg/dL (ref 87–352)
IgG (Immunoglobin G), Serum: 1227 mg/dL (ref 586–1602)
IgM (Immunoglobulin M), Srm: 149 mg/dL (ref 26–217)

## 2023-07-08 LAB — COMPLEMENT, TOTAL

## 2023-07-13 ENCOUNTER — Encounter: Payer: Self-pay | Admitting: Allergy & Immunology

## 2023-07-20 ENCOUNTER — Ambulatory Visit: Payer: 59 | Admitting: Allergy & Immunology

## 2023-07-29 ENCOUNTER — Ambulatory Visit (INDEPENDENT_AMBULATORY_CARE_PROVIDER_SITE_OTHER): Payer: 59

## 2023-07-29 DIAGNOSIS — Z23 Encounter for immunization: Secondary | ICD-10-CM | POA: Diagnosis not present

## 2023-07-29 DIAGNOSIS — B999 Unspecified infectious disease: Secondary | ICD-10-CM | POA: Diagnosis not present

## 2023-07-29 NOTE — Progress Notes (Signed)
Immunotherapy   Patient Details  Name: Caroline Gill MRN: [id number] Date of Birth: 1975/07/06  07/29/2023  Caroline Gill    Patient is here to receive a Pneumovax 23 vaccine.  NDC: [id number] Lot: [id number] EXP: 05/20/2025 Dose: 0.5 mg  Site: Right Deltoid  PCV23 VIS given to patient.   Ameriah, Lint 07/29/2023, 11:53 AM

## 2023-07-29 NOTE — Patient Instructions (Signed)
Pneumococcal Polysaccharide Vaccine (PPSV23): What You Need to Know Many vaccine information statements are available in Spanish and other languages. See PromoAge.com.br. 1. Why get vaccinated? Pneumococcal polysaccharide vaccine (PPSV23) can prevent pneumococcal disease. Pneumococcal disease refers to any illness caused by pneumococcal bacteria. These bacteria can cause many types of illnesses, including pneumonia, which is an infection of the lungs. Pneumococcal bacteria are one of the most common causes of pneumonia. Besides pneumonia, pneumococcal bacteria can also cause: Ear infections Sinus infections Meningitis (infection of the tissue covering the brain and spinal cord) Bacteremia (bloodstream infection) Anyone can get pneumococcal disease, but children under 35 years of age, people with certain medical conditions, adults 65 years or older, and cigarette smokers are at the highest risk. Most pneumococcal infections are mild. However, some can result in long-term problems, such as brain damage or hearing loss. Meningitis, bacteremia, and pneumonia caused by pneumococcal disease can be fatal. 2. PPSV23 PPSV23 protects against 23 types of bacteria that cause pneumococcal disease. PPSV23 is recommended for: All adults 65 years or older, Anyone 2 years or older with certain medical conditions that can lead to an increased risk for pneumococcal disease. Most people need only one dose of PPSV23. A second dose of PPSV23, and another type of pneumococcal vaccine called PCV13, are recommended for certain high-risk groups. Your health care provider can give you more information. People 65 years or older should get a dose of PPSV23 even if they have already gotten one or more doses of the vaccine before they turned 57. 3. Talk with your health care provider Tell your vaccine provider if the person getting the vaccine: Has had an allergic reaction after a previous dose of PPSV23, or has any  severe, life-threatening allergies. In some cases, your health care provider may decide to postpone PPSV23 vaccination to a future visit. People with minor illnesses, such as a cold, may be vaccinated. People who are moderately or severely ill should usually wait until they recover before getting PPSV23. Your health care provider can give you more information. 4. Risks of a vaccine reaction Redness or pain where the shot is given, feeling tired, fever, or muscle aches can happen after PPSV23. People sometimes faint after medical procedures, including vaccination. Tell your provider if you feel dizzy or have vision changes or ringing in the ears. As with any medicine, there is a very remote chance of a vaccine causing a severe allergic reaction, other serious injury, or death. 5. What if there is a serious problem? An allergic reaction could occur after the vaccinated person leaves the clinic. If you see signs of a severe allergic reaction (hives, swelling of the face and throat, difficulty breathing, a fast heartbeat, dizziness, or weakness), call 9-1-1 and get the person to the nearest hospital. For other signs that concern you, call your health care provider. Adverse reactions should be reported to the Vaccine Adverse Event Reporting System (VAERS). Your health care provider will usually file this report, or you can do it yourself. Visit the VAERS website at www.vaers.LAgents.no or call 504-409-5837. VAERS is only for reporting reactions, and VAERS staff do not give medical advice. 6. How can I learn more? Ask your health care provider. Call your local or state health department. Contact the Centers for Disease Control and Prevention (CDC): Call 321-608-8805 (1-800-CDC-INFO) or Visit CDC's website at PicCapture.uy Source: CDC Vaccine Information Statement PPSV23 Vaccine (04/13/2018) This same material is available at FootballExhibition.com.br for no charge. This information is not intended to replace  advice given to you by your health care provider. Make sure you discuss any questions you have with your health care provider. Document Revised: 09/16/2022 Document Reviewed: 06/22/2022 Elsevier Patient Education  2024 [employer].

## 2023-08-03 ENCOUNTER — Ambulatory Visit: Payer: 59 | Admitting: Sports Medicine

## 2023-08-05 ENCOUNTER — Encounter: Payer: Self-pay | Admitting: Family Medicine

## 2023-08-05 ENCOUNTER — Ambulatory Visit: Payer: 59 | Admitting: Family Medicine

## 2023-08-05 VITALS — BP 122/78 | HR 85 | Temp 98.3°F | Wt 254.6 lb

## 2023-08-05 DIAGNOSIS — R0989 Other specified symptoms and signs involving the circulatory and respiratory systems: Secondary | ICD-10-CM

## 2023-08-05 DIAGNOSIS — J4520 Mild intermittent asthma, uncomplicated: Secondary | ICD-10-CM

## 2023-08-05 NOTE — Patient Instructions (Signed)

## 2023-08-05 NOTE — Progress Notes (Signed)
 No      Caroline Gill , 1975/08/10, 48 y.o., female MRN: [id number] Patient Care Team    Relationship Specialty Notifications Start End  Natalia Leatherwood, DO PCP - General Family Medicine  01/07/22   Aletha Halim, MD Consulting Physician Neurology  01/07/22   Ronda Fairly. Nicanor Bake, MD Consulting Physician Cardiology  01/07/22   Edwyna Shell, MD Referring Physician Hematology and Oncology  01/07/22   Rolland Bimler T  Gastroenterology  01/07/22    Comment: Concepcion Living, MD Referring Physician Pulmonary Disease  01/07/22   Alfonse Spruce, MD Consulting Physician Allergy and Immunology  08/05/23   Richardean Sale, DO Consulting Physician Sports Medicine  08/05/23   Fuller Plan, MD Consulting Physician Rheumatology  08/05/23     Chief Complaint  Patient presents with   Chest Congestion    1 month; chest/ nasal congestion, cough, on and off fatigue, wheezing. Pt has been using prescribed inhalers and OTC cold & cough.      Subjective: Caroline Gill is a 48 y.o. Pt presents for an OV with complaints of chest congestion of 4 weeks.  Associated symptoms include mild cough, mostly nonproductive small amount of fluids at times, feeling more fatigued and occasional wheezing. Patient was vaccinated with Pneumovax 23 vaccine 07/29/2023. Patient denies fevers, chills, sinus pressure symptoms, sore throat or new onset illness.  Patient is established with asthma and allergy.A&A prescribes Symbicort 160-4.52 puffs twice daily 1 to 2 weeks during illness only, Singulair, Xyzal, albuterol, Pepcid and EpiPen.  Patient reports she has continued the Symbicort.  OV 04/26/11/2024: Z-Pak, prednisone burst, Symbicort, albuterol Virtual 12/3-Doxy, Tessalon Perles, albuterol, Symbicort. OV 05/2023: Persistent symptoms.  Treated with azithromycin, prednisone, albuterol, Symbicort, Atrovent A&A OV 07/01/2023: Routine visit every 6 months     04/08/2023    8:13 AM 04/06/2022    8:45 AM  01/07/2022   10:02 AM  Depression screen PHQ 2/9  Decreased Interest 1 0 3  Down, Depressed, Hopeless 1 0 1  PHQ - 2 Score 2 0 4  Altered sleeping  1   Tired, decreased energy  1   Change in appetite  0   Feeling bad or failure about yourself  3 1   Trouble concentrating 1 1   Moving slowly or fidgety/restless 0 0   Suicidal thoughts 0 0   PHQ-9 Score  4   Difficult doing work/chores Somewhat difficult      Allergies  Allergen Reactions   Oxycodone Other (See Comments)    Severe headache Severe headache Severe headache   Tapentadol Rash   Social History   Social History Narrative   Marital status/children/pets: Married.  3 children.   Education/employment: College-educated.  Owner/CFO-employed   Safety:      -Wears a bicycle helmet riding a bike: Yes     -smoke alarm in the home:Yes     - wears seatbelt: Yes     - Feels safe in their relationships: Yes      Past Medical History:  Diagnosis Date   Arthritis    Asthma    Atrial fibrillation (HCC)    Chicken pox    Depression    Elevated WBC count 01/07/2022   Migraine    SVT (supraventricular tachycardia) (HCC)    Past Surgical History:  Procedure Laterality Date   BREAST BIOPSY  2020   CHOLECYSTECTOMY  2013   ENDOMETRIAL ABLATION  2013   KNEE ARTHROSCOPY Right 2018   meniscus   KNEE  SURGERY Left 1992   "dislox"   SHOULDER ARTHROSCOPY  2018   Labral tear with anchors x3   VAGINAL DELIVERY     X3, 1996, 1999, 2002   Family History  Problem Relation Age of Onset   Skin cancer Mother    Asthma Mother    Osteoarthritis Mother    Thyroid disease Mother    Diabetes Mother    Stroke Mother    Atrial fibrillation Mother    Alcohol abuse Mother    Depression Mother    Hypertension Father    Osteoarthritis Father    Diabetes Father    Atrial fibrillation Father    Stroke Father    Hypertension Brother    Osteoarthritis Brother    Alcohol abuse Brother    Asthma Brother    Drug abuse Brother     Skin cancer Brother    Hypertension Maternal Grandmother    Diabetes Maternal Grandmother    Depression Maternal Grandmother    Osteoarthritis Maternal Grandmother    Miscarriages / Stillbirths Maternal Grandmother    Bone cancer Maternal Grandfather        late 34s   Dementia Maternal Grandfather    Rheum arthritis Paternal Grandmother    Depression Paternal Grandmother    Migraines Paternal Grandmother    Heart attack Paternal Grandfather 64   Depression Daughter    Anxiety disorder Daughter    ADD / ADHD Daughter    ADD / ADHD Son    Hypothyroidism Son    ADD / ADHD Son    Angioedema Neg Hx    Allergic rhinitis Neg Hx    Immunodeficiency Neg Hx    Urticaria Neg Hx    Eczema Neg Hx    Allergies as of 08/05/2023       Reactions   Oxycodone Other (See Comments)   Severe headache Severe headache Severe headache   Tapentadol Rash        Medication List        Accurate as of August 05, 2023 11:07 AM. If you have any questions, ask your nurse or doctor.          STOP taking these medications    cyclobenzaprine 5 MG tablet Commonly known as: FLEXERIL Stopped by: Felix Pacini       TAKE these medications    albuterol 108 (90 Base) MCG/ACT inhaler Commonly known as: VENTOLIN HFA Inhale 2 puffs into the lungs every 6 (six) hours as needed for wheezing or shortness of breath.   EPINEPHrine 0.3 mg/0.3 mL Soaj injection Commonly known as: Auvi-Q Inject 0.3 mg into the muscle as needed for anaphylaxis.   famotidine 20 MG tablet Commonly known as: PEPCID Take 2 tablets (40 mg total) by mouth daily.   Fish Oil 1000 MG Caps Take by mouth.   Klor-Con 20 MEQ packet Generic drug: potassium chloride Take 20 mEq by mouth daily.   levocetirizine 5 MG tablet Commonly known as: XYZAL Take 1 tablet (5 mg total) by mouth daily.   MAGNESIUM GLYCINATE PO Take by mouth.   meloxicam 15 MG tablet Commonly known as: MOBIC Take 15 mg by mouth daily.    montelukast 10 MG tablet Commonly known as: SINGULAIR Take 1 tablet (10 mg total) by mouth at bedtime.   RyVent 6 MG Tabs Generic drug: Carbinoxamine Maleate Take 1 tablet (6 mg) by mouth 2 (two) times daily as needed. Oral for 30 Days   Symbicort 160-4.5 MCG/ACT inhaler Generic drug: budesonide-formoterol Inhale 2  puffs into the lungs 2 (two) times daily.   triamcinolone ointment 0.5 % Commonly known as: KENALOG Apply 1 Application topically 2 (two) times daily as needed.   triamterene-hydrochlorothiazide 37.5-25 MG tablet Commonly known as: MAXZIDE-25 Take 1 tablet by mouth 2 (two) times daily.   VALACYCLOVIR HCL PO Take 1 tablet by mouth 2 (two) times daily.        All past medical history, surgical history, allergies, family history, immunizations andmedications were updated in the EMR today and reviewed under the history and medication portions of their EMR.     ROS Negative, with the exception of above mentioned in HPI   Objective:  BP 122/78   Pulse 85   Temp 98.3 F (36.8 C)   Wt 254 lb 9.6 oz (115.5 kg)   SpO2 97%   BMI 36.53 kg/m  Body mass index is 36.53 kg/m. Physical Exam Vitals and nursing note reviewed.  Constitutional:      General: She is not in acute distress.    Appearance: Normal appearance. She is normal weight. She is not ill-appearing or toxic-appearing.  HENT:     Head: Normocephalic and atraumatic.     Right Ear: Tympanic membrane, ear canal and external ear normal. There is no impacted cerumen.     Left Ear: Tympanic membrane, ear canal and external ear normal. There is no impacted cerumen.     Nose: Rhinorrhea (mild, with mild PND) present. No congestion.     Mouth/Throat:     Mouth: Mucous membranes are moist.     Pharynx: No oropharyngeal exudate or posterior oropharyngeal erythema.  Eyes:     General: No scleral icterus.       Right eye: No discharge.        Left eye: No discharge.     Extraocular Movements: Extraocular  movements intact.     Conjunctiva/sclera: Conjunctivae normal.     Pupils: Pupils are equal, round, and reactive to light.  Cardiovascular:     Rate and Rhythm: Normal rate and regular rhythm.  Pulmonary:     Effort: Pulmonary effort is normal. No respiratory distress.     Breath sounds: No stridor. No wheezing, rhonchi or rales.     Comments: Lungs CTAB, with the exception of very small end inspiratory squeak right lower lobe. Musculoskeletal:     Cervical back: Neck supple. No tenderness.  Lymphadenopathy:     Cervical: No cervical adenopathy.  Skin:    Findings: No rash.  Neurological:     Mental Status: She is alert and oriented to person, place, and time. Mental status is at baseline.     Motor: No weakness.     Coordination: Coordination normal.     Gait: Gait normal.  Psychiatric:        Mood and Affect: Mood normal.        Behavior: Behavior normal.        Thought Content: Thought content normal.        Judgment: Judgment normal.    No results found. No results found. No results found for this or any previous visit (from the past 24 hours).  Assessment/Plan: Corrina Steffensen is a 48 y.o. female present for OV for  Mild intermittent extrinsic asthma without complication (Primary)/Chest congestion Patient has an end inspiration small squeak  in the right lower lobe.  No sign of infection on exam today.  Vitals are stable, no fevers or chills. Albuterol every 12 hours 3 days, then as needed.  Encourage patient to focus on taking deep breaths multiple times throughout the day. Since she has had 2 rounds of prednisone within the last couple months, elected to avoid use unless symptoms do not improve. Continue Symbicort 2 puffs twice daily and her annual allergy regimen. If she does not see improvement in her symptoms would encourage her to discuss with her asthma/allergist.   Reviewed expectations re: course of current medical issues. Discussed self-management of  symptoms. Outlined signs and symptoms indicating need for more acute intervention. Patient verbalized understanding and all questions were answered. Patient received an After-Visit Summary.    No orders of the defined types were placed in this encounter.  No orders of the defined types were placed in this encounter.  Referral Orders  No referral(s) requested today     Note is dictated utilizing voice recognition software. Although note has been proof read prior to signing, occasional typographical errors still can be missed. If any questions arise, please do not hesitate to call for verification.   electronically signed by:  Felix Pacini, DO  [facility] [place]

## 2023-09-23 ENCOUNTER — Encounter: Payer: Self-pay | Admitting: Family Medicine

## 2023-09-23 ENCOUNTER — Ambulatory Visit: Payer: Self-pay

## 2023-09-23 ENCOUNTER — Ambulatory Visit: Admitting: Family Medicine

## 2023-09-23 VITALS — BP 110/76 | HR 80 | Temp 98.9°F | Ht 70.0 in | Wt 256.0 lb

## 2023-09-23 DIAGNOSIS — Z20822 Contact with and (suspected) exposure to covid-19: Secondary | ICD-10-CM | POA: Diagnosis not present

## 2023-09-23 LAB — POC COVID19 BINAXNOW: SARS Coronavirus 2 Ag: NEGATIVE

## 2023-09-23 MED ORDER — NIRMATRELVIR/RITONAVIR (PAXLOVID)TABLET: 3.0000 | ORAL_TABLET | Freq: Two times a day (BID) | ORAL | 0 refills | Status: AC

## 2023-09-23 NOTE — Assessment & Plan Note (Addendum)
 Rapid Covid negative in office. Presumed infectious due to close exposure with friend in car for hours. Will treat accordingly.  History of asthma with some wheezing at home, worse at night. Lungs clear in office today. Vital signs are normal.  TMs normal. Describes SOB with exertion. No obvious SOB today. Conversing with provider without need to stop. Supportive therapy. Paxlovid as instructed.  Not on statin. Symptoms reviewed that warrant seeking higher level of care. Follow-up if symptoms do not resolve.

## 2023-09-23 NOTE — Progress Notes (Signed)
 Acute Office Visit  Subjective:     Patient ID: Caroline Gill, female    DOB: [birth year], 48 y.o.   MRN: [id number]  Chief Complaint  Patient presents with   Covid Exposure    Friend diagnosed with COVID on Monday. Patient has cough, SOB, wheezing and ear pressure    HPI Patient is in today for symptoms of Covid after close exposure on Monday. She was driving around in the car with this friend.  Cough, shortness of breath, wheezing, and ear pressure. Nasal congestion, Mucinex.  History of asthma: albuterol and Symbicort inhalers.  Wheezing woke her up last night. Short of breath with exertion.  Able to converse with provider without need to stop to catch breath.     Review of Systems  HENT:         Ear pressure.   Respiratory:  Positive for cough, shortness of breath and wheezing.         Objective:    BP 110/76 (BP Location: Left Arm, Cuff Size: Large)   Pulse 80   Temp 98.9 F (37.2 C) (Oral)   Ht 5\' 10"  (1.778 m)   Wt 256 lb (116.1 kg)   LMP  (LMP Unknown)   SpO2 96%   BMI 36.73 kg/m  BP Readings from Last 3 Encounters:  09/23/23 110/76  08/05/23 122/78  07/06/23 120/72      Physical Exam Vitals and nursing note reviewed.  Constitutional:      General: She is not in acute distress.    Appearance: Normal appearance. She is not ill-appearing.  HENT:     Right Ear: Tympanic membrane normal.     Left Ear: Tympanic membrane normal.     Nose:     Right Sinus: Maxillary sinus tenderness present. No frontal sinus tenderness.     Left Sinus: Maxillary sinus tenderness present. No frontal sinus tenderness.     Mouth/Throat:     Pharynx: Uvula midline. No oropharyngeal exudate or posterior oropharyngeal erythema.  Cardiovascular:     Rate and Rhythm: Normal rate and regular rhythm.     Heart sounds: Normal heart sounds.  Pulmonary:     Effort: Pulmonary effort is normal.     Breath sounds: Normal breath sounds.  Skin:    General: Skin is warm and dry.   Neurological:     General: No focal deficit present.     Mental Status: She is alert. Mental status is at baseline.  Psychiatric:        Mood and Affect: Mood normal.        Behavior: Behavior normal.        Thought Content: Thought content normal.        Judgment: Judgment normal.    Results for orders placed or performed in visit on 09/23/23  POC COVID-19 BinaxNow  Result Value Ref Range   SARS Coronavirus 2 Ag Negative Negative        Assessment & Plan:   Problem List Items Addressed This Visit     Close exposure to COVID-19 virus - Primary   Rapid Covid negative in office. Presumed infectious due to close exposure with friend in car for hours. Will treat accordingly.  History of asthma with some wheezing at home, worse at night. Lungs clear in office today. Vital signs are normal.  TMs normal. Describes SOB with exertion. No obvious SOB today. Conversing with provider without need to stop. Supportive therapy. Paxlovid as instructed.  Not on statin. Symptoms reviewed  that warrant seeking higher level of care. Follow-up if symptoms do not resolve.        Relevant Medications   nirmatrelvir/ritonavir (PAXLOVID) 20 x 150 MG & 10 x 100MG  TABS   Other Relevant Orders   POC COVID-19 BinaxNow (Completed)  Agrees with plan of care discussed.  Questions answered.   Meds ordered this encounter  Medications   nirmatrelvir/ritonavir (PAXLOVID) 20 x 150 MG & 10 x 100MG  TABS    Sig: Take 3 tablets by mouth 2 (two) times daily for 5 days. (Take nirmatrelvir 150 mg two tablets twice daily for 5 days and ritonavir 100 mg one tablet twice daily for 5 days) Patient GFR is 96.    Dispense:  30 tablet    Refill:  0    Supervising Provider:   Suzan Slick [[id number]]  Agrees with plan of care discussed.  Questions answered.   Return if symptoms worsen or fail to improve.  Novella Olive, FNP

## 2023-09-23 NOTE — Telephone Encounter (Signed)
 Chief Complaint: wheezing Symptoms: wheezing, cough, congestion, COVID exposure Frequency: since Monday Pertinent Negatives: Patient denies fever, chills, N/V, SOB at rest Disposition: [] ED /[] Urgent Care (no appt availability in office) / [x] Appointment(In office/virtual)/ []  [facility]/ [] Home Care/ [] Refused Recommended Disposition /[] [facility]/ []  Follow-up with PCP Additional Notes: Pt reports cough, wheezing, sneezing after staying with a friend that had tested positive for COVID. Pt has a hx of asthma and reports wheezing with lying down. Pt denies SOB at rest or difficulty breathing other than wheezing. Pt does endorse SOB on exertion going up the stairs, states that is new. Pt used her symbicort inhaler this AM. RN advised pt she should be seen within 4 hours. No availability within that time frame at pt's office. RN scheduled pt for today at 1050 at [facility]. Pt agreeable to that plan. RN advised pt if she develops worsening breathing difficulty, SOB at rest, or CP she needs to go to the ED. Pt verbalized understanding.    Copied from CRM [id number]. Topic: Clinical - Red Word Triage >> Sep 23, 2023  9:05 AM Baldemar Lenis P wrote: [facility] that prompted transfer to Nurse Triage: difficult breathing Reason for Disposition  [1] MILD difficulty breathing (e.g., minimal/no SOB at rest, SOB with walking, pulse <100) AND [2] NEW-onset or WORSE than normal  Answer Assessment - Initial Assessment Questions 1. RESPIRATORY STATUS: "Describe your breathing?" (e.g., wheezing, shortness of breath, unable to speak, severe coughing)      Wheezing with laying down and sleeping, states it will wake her up. States she can still breathe OK but it wakes her up 2. ONSET: "When did this breathing problem begin?"      Monday, other symptoms started next evening. Pt was at her friend's house at that time and that friend was diagnosed with COVID 3. PATTERN "Does the difficult breathing come and  go, or has it been constant since it started?"      Stairs comes and goes, wheezing started last night 4. SEVERITY: "How bad is your breathing?" (e.g., mild, moderate, severe)    - MILD: No SOB at rest, mild SOB with walking, speaks normally in sentences, can lie down, no retractions, pulse < 100.    - MODERATE: SOB at rest, SOB with minimal exertion and prefers to sit, cannot lie down flat, speaks in phrases, mild retractions, audible wheezing, pulse 100-120.    - SEVERE: Very SOB at rest, speaks in single words, struggling to breathe, sitting hunched forward, retractions, pulse > 120      Mild - "gets winded with stairs", normally doesn't, no SOB at rest 5. RECURRENT SYMPTOM: "Have you had difficulty breathing before?" If Yes, ask: "When was the last time?" and "What happened that time?"      Yes, hx of asthma 6. CARDIAC HISTORY: "Do you have any history of heart disease?" (e.g., heart attack, angina, bypass surgery, angioplasty)      No 7. LUNG HISTORY: "Do you have any history of lung disease?"  (e.g., pulmonary embolus, asthma, emphysema)     Allergies, asthma 8. CAUSE: "What do you think is causing the breathing problem?"      COVID 9. OTHER SYMPTOMS: "Do you have any other symptoms? (e.g., dizziness, runny nose, cough, chest pain, fever)     Congestion, "a lot of congestion in my ears and nose", cough (states it was productive "the last time" and stated there was a speck of pink), sneezing. Hx of asthma, took her Symbicort  inhaler this AM. Pt  states she had a "little chest pain" this AM before taking her symbicort, 1/10 10. O2 SATURATION MONITOR:  "Do you use an oxygen saturation monitor (pulse oximeter) at home?" If Yes, ask: "What is your reading (oxygen level) today?" "What is your usual oxygen saturation reading?" (e.g., 95%)       Pt states the battery is dead on her pulse ox  Protocols used: Breathing Difficulty-A-AH

## 2023-09-23 NOTE — Patient Instructions (Signed)
Viral illnesses can take up to 10 days to resolve.   There is no role for an antibiotic.  Symptomatic treatment is ideal.   Take over the counter pain medication as needed. Acetaminophen and ibuprofen for fever and body aches. Mucinex and Robitussin for cough, if you have high blood pressure, take Coricidin for cough. Honey is also effective for cough, avoid if diabetic. Flonase or saline nasal spray for nasal congestion.    Read and follow instructions on the label and make sure not to combine other medications that may have same ingredients in it. It is important to not take too much.    Drink plenty of caffeine-free fluids. (If you have heart or kidney problems, follow the instructions of your specialist regarding amounts). Adequate fluids will help you to avoid dehydration.   If you are hungry, eat a bland diet, such as the BRAT diet (bananas, rice, applesauce, toast). Diet as tolerated if appetite is normal.   Get lots of rest.  Let us know if you are not improving or getting worse.

## 2023-09-24 ENCOUNTER — Ambulatory Visit: Admitting: Physician Assistant

## 2023-09-26 IMAGING — DX DG CHEST 2V
2 series · 2 of 2 positions shown · non-contrast
Comparison: Two-view chest x-ray 10/02/2019

CLINICAL DATA: Subacute cough 1 month.

EXAM:
CHEST - 2 VIEW

[chest pa]
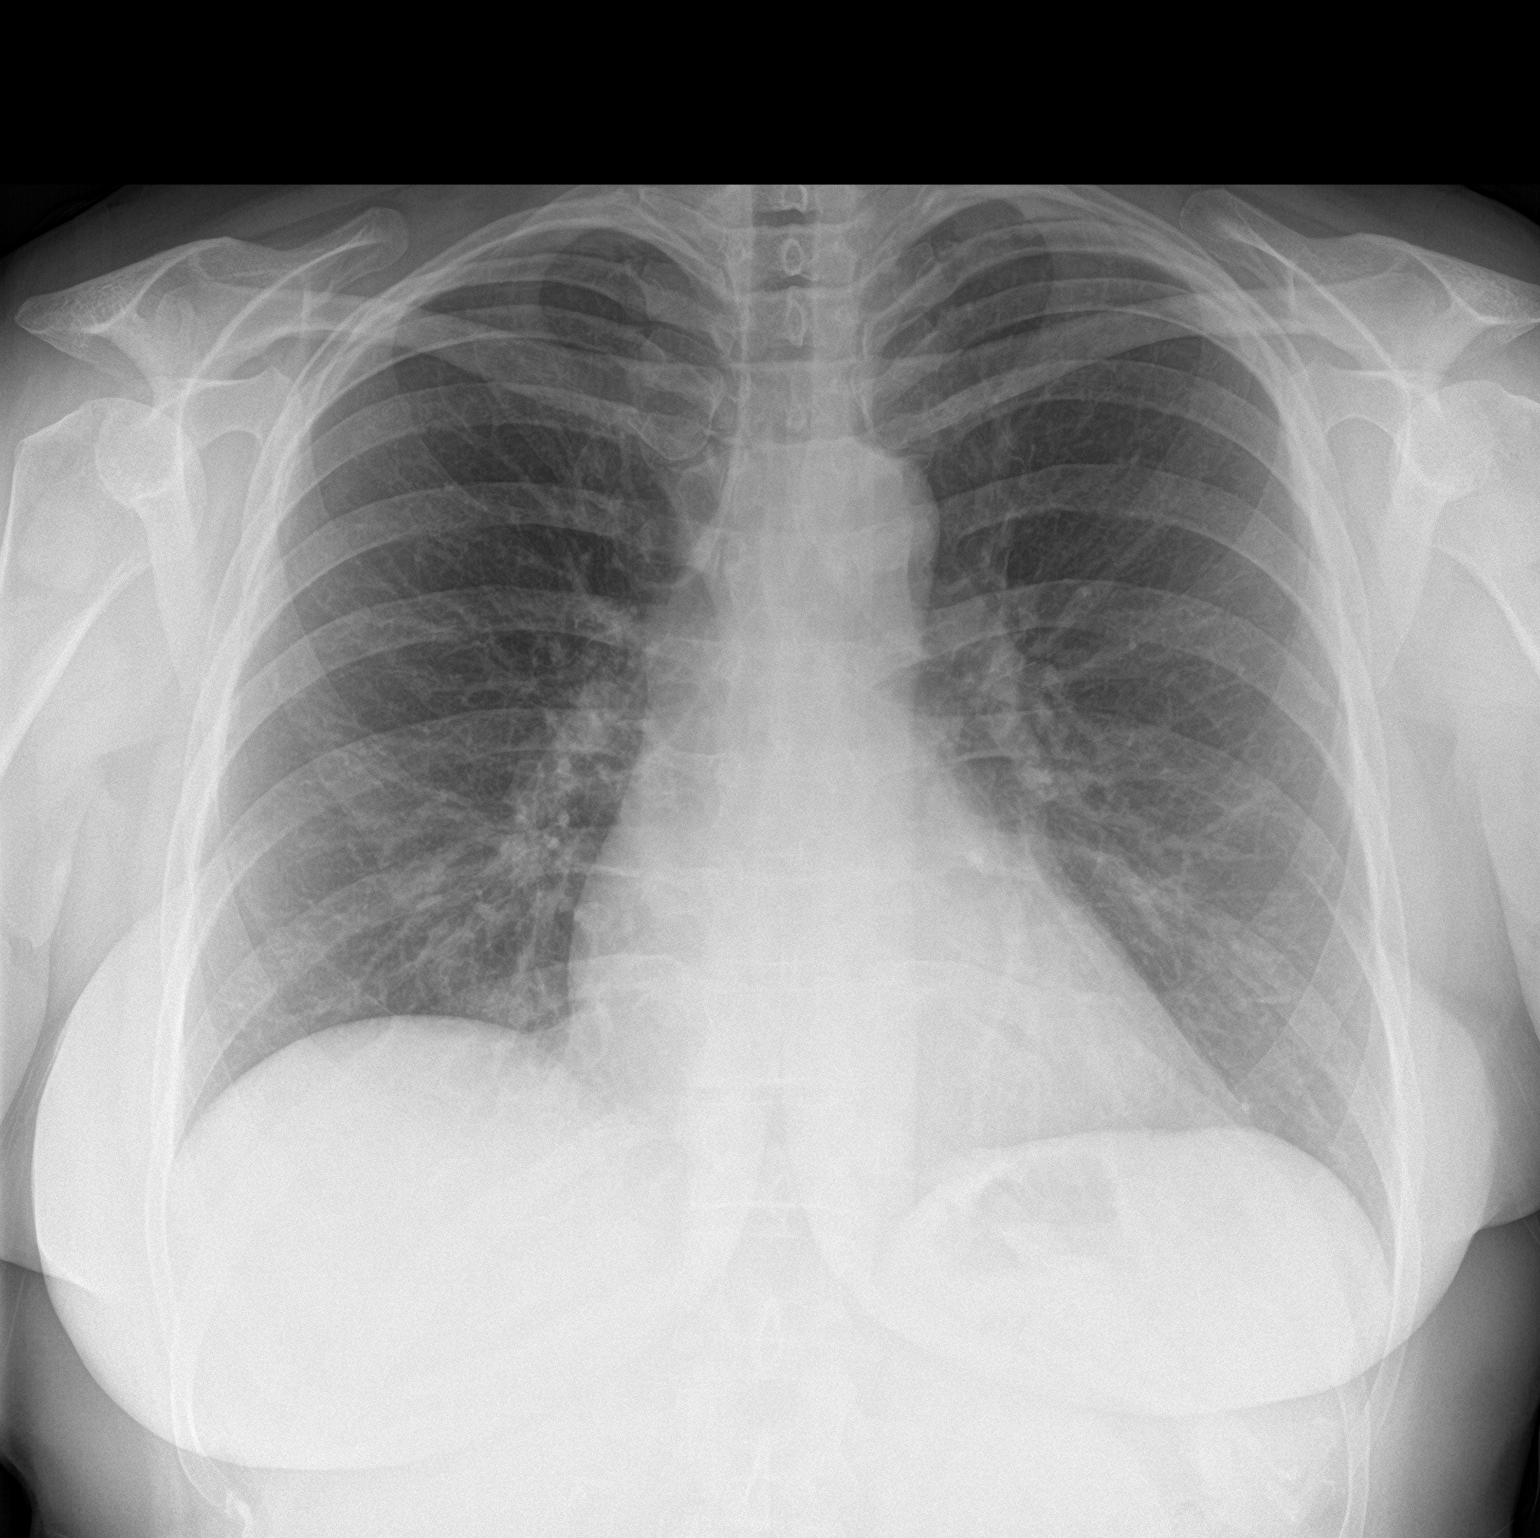

[chest lat]
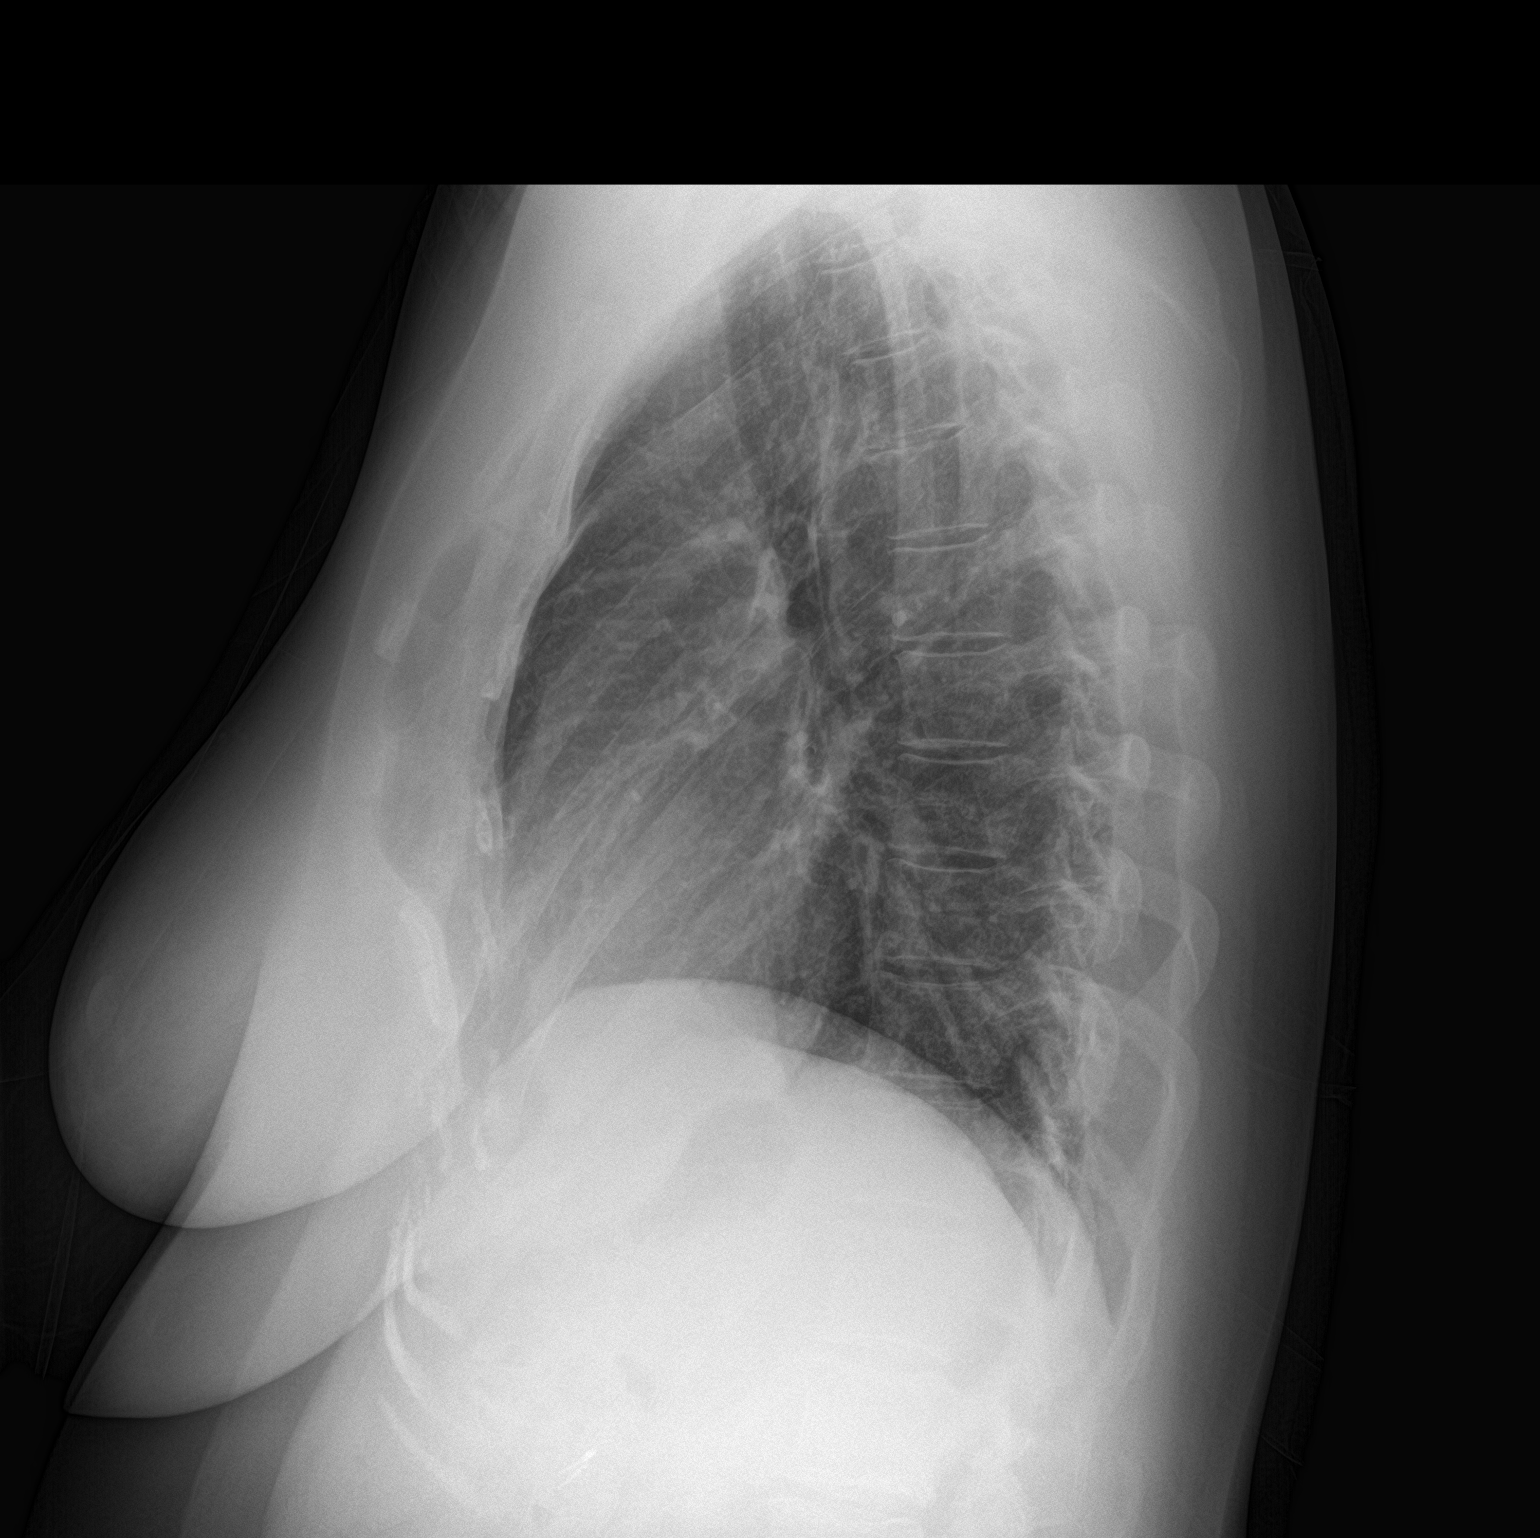

[2 of 2 positions shown; findings below may reference images not displayed]

FINDINGS: The heart size and mediastinal contours are within normal limits.
Both lungs are clear. The visualized skeletal structures are
unremarkable.
IMPRESSION: No active cardiopulmonary disease.

## 2023-11-04 ENCOUNTER — Telehealth: Payer: Self-pay | Admitting: Internal Medicine

## 2023-11-04 NOTE — Telephone Encounter (Signed)
 Patient called stating Dr. Rodell Citrin wanted her to schedule an appointment whenever she experienced a flair.  Due to Dr. Rodell Citrin being out of the office until 11/09/23, patient is asking if she should have labwork during her flair.  Patient did schedule a follow-up appointment in July.

## 2023-12-28 ENCOUNTER — Other Ambulatory Visit: Payer: Self-pay | Admitting: Allergy & Immunology

## 2023-12-30 ENCOUNTER — Ambulatory Visit: Payer: 59 | Admitting: Allergy & Immunology

## 2024-01-04 ENCOUNTER — Encounter: Payer: Self-pay | Admitting: Internal Medicine

## 2024-01-04 ENCOUNTER — Ambulatory Visit: Attending: Internal Medicine | Admitting: Internal Medicine

## 2024-01-04 ENCOUNTER — Ambulatory Visit (INDEPENDENT_AMBULATORY_CARE_PROVIDER_SITE_OTHER): Admitting: Family Medicine

## 2024-01-04 VITALS — BP 114/76 | HR 74 | Temp 98.4°F | Wt 249.0 lb

## 2024-01-04 VITALS — BP 112/77 | HR 84 | Resp 14 | Ht 70.5 in | Wt 250.0 lb

## 2024-01-04 DIAGNOSIS — R899 Unspecified abnormal finding in specimens from other organs, systems and tissues: Secondary | ICD-10-CM | POA: Diagnosis not present

## 2024-01-04 DIAGNOSIS — R768 Other specified abnormal immunological findings in serum: Secondary | ICD-10-CM

## 2024-01-04 DIAGNOSIS — J329 Chronic sinusitis, unspecified: Secondary | ICD-10-CM

## 2024-01-04 DIAGNOSIS — J3489 Other specified disorders of nose and nasal sinuses: Secondary | ICD-10-CM

## 2024-01-04 DIAGNOSIS — M2550 Pain in unspecified joint: Secondary | ICD-10-CM | POA: Diagnosis not present

## 2024-01-04 DIAGNOSIS — H9209 Otalgia, unspecified ear: Secondary | ICD-10-CM | POA: Diagnosis not present

## 2024-01-04 DIAGNOSIS — B9689 Other specified bacterial agents as the cause of diseases classified elsewhere: Secondary | ICD-10-CM | POA: Diagnosis not present

## 2024-01-04 MED ORDER — CEFDINIR 300 MG PO CAPS: 300.0000 mg | ORAL_CAPSULE | Freq: Two times a day (BID) | ORAL | 0 refills | Status: DC

## 2024-01-04 MED ORDER — PREDNISONE 20 MG PO TABS: 20.0000 mg | ORAL_TABLET | Freq: Every day | ORAL | 0 refills | Status: AC

## 2024-01-04 MED ORDER — FLUCONAZOLE 150 MG PO TABS: 150.0000 mg | ORAL_TABLET | Freq: Once | ORAL | 0 refills | Status: AC

## 2024-01-04 NOTE — Patient Instructions (Addendum)

## 2024-01-04 NOTE — Progress Notes (Signed)
 Caroline Gill , [birth year], 48 y.o., female MRN: [id number] Patient Care Team    Relationship Specialty Notifications Start End  Catherine Charlies LABOR, DO PCP - General Family Medicine  01/07/22   Euna Read, MD Consulting Physician Neurology  01/07/22   Mabel PARAS. Janan, MD Consulting Physician Cardiology  01/07/22   Kelby Melvern PENNER, MD Referring Physician Hematology and Oncology  01/07/22   Romilda Shove T  Gastroenterology  01/07/22    Comment: GAP  Javaid, Adnan, MD Referring Physician Pulmonary Disease  01/07/22   Iva Marty Saltness, MD Consulting Physician Allergy  and Immunology  08/05/23   Leonce Katz, DO Consulting Physician Sports Medicine  08/05/23   Jeannetta Lonni ORN, MD Consulting Physician Rheumatology  08/05/23     Chief Complaint  Patient presents with   Ear Pain    1 week; L ear. Also c/o sinus pain/pressure. Pt has taken an OTC decongestant.      Subjective: Caroline Gill is a 48 y.o. Pt presents for an OV with complaints of left  ear and sinus pain of 1 week duration.  Associated symptoms include congestion and weakness. Patient reports she had bilateral ear infections status post COVID exposure in April.  Treated with Augmentin at that time.  She never feels left ear returned to normal after that infection. Pt has tried OTC decongestant to ease their symptoms.  ROS below     04/08/2023    8:13 AM 04/06/2022    8:45 AM 01/07/2022   10:02 AM  Depression screen PHQ 2/9  Decreased Interest 1 0 3  Down, Depressed, Hopeless 1 0 1  PHQ - 2 Score 2 0 4  Altered sleeping  1   Tired, decreased energy  1   Change in appetite  0   Feeling bad or failure about yourself  3 1   Trouble concentrating 1 1   Moving slowly or fidgety/restless 0 0   Suicidal thoughts 0 0   PHQ-9 Score  4   Difficult doing work/chores Somewhat difficult      Allergies  Allergen Reactions   Oxycodone Other (See Comments)    Severe headache Severe headache Severe headache    Tapentadol Rash   Social History   Social History Narrative   Marital status/children/pets: Married.  3 children.   Education/employment: College-educated.  Owner/CFO-employed   Safety:      -Wears a bicycle helmet riding a bike: Yes     -smoke alarm in the home:Yes     - wears seatbelt: Yes     - Feels safe in their relationships: Yes      Past Medical History:  Diagnosis Date   Arthritis    Asthma    Atrial fibrillation (HCC)    Chicken pox    Depression    Elevated WBC count 01/07/2022   Migraine    SVT (supraventricular tachycardia) (HCC)    Past Surgical History:  Procedure Laterality Date   BREAST BIOPSY  2020   CHOLECYSTECTOMY  2013   ENDOMETRIAL ABLATION  2013   KNEE ARTHROSCOPY Right 2018   meniscus   KNEE SURGERY Left 1992   dislox   SHOULDER ARTHROSCOPY  2018   Labral tear with anchors x3   VAGINAL DELIVERY     X3, 1996, 1999, 2002   Family History  Problem Relation Age of Onset   Skin cancer Mother    Asthma Mother    Osteoarthritis Mother    Thyroid  disease Mother  Diabetes Mother    Stroke Mother    Atrial fibrillation Mother    Alcohol abuse Mother    Depression Mother    Hypertension Father    Osteoarthritis Father    Diabetes Father    Atrial fibrillation Father    Stroke Father    Hypertension Brother    Osteoarthritis Brother    Alcohol abuse Brother    Asthma Brother    Drug abuse Brother    Skin cancer Brother    Hypertension Maternal Grandmother    Diabetes Maternal Grandmother    Depression Maternal Grandmother    Osteoarthritis Maternal Grandmother    Miscarriages / Stillbirths Maternal Grandmother    Bone cancer Maternal Grandfather        late 40s   Dementia Maternal Grandfather    Rheum arthritis Paternal Grandmother    Depression Paternal Grandmother    Migraines Paternal Grandmother    Heart attack Paternal Grandfather 48   Depression Daughter    Anxiety disorder Daughter    ADD / ADHD Daughter    ADD /  ADHD Son    Hypothyroidism Son    ADD / ADHD Son    Angioedema Neg Hx    Allergic rhinitis Neg Hx    Immunodeficiency Neg Hx    Urticaria Neg Hx    Eczema Neg Hx    Allergies as of 01/04/2024       Reactions   Oxycodone Other (See Comments)   Severe headache Severe headache Severe headache   Tapentadol Rash        Medication List        Accurate as of January 04, 2024 11:18 AM. If you have any questions, ask your nurse or doctor.          STOP taking these medications    famotidine  20 MG tablet Commonly known as: PEPCID  Stopped by: Charlies Bellini   meloxicam  15 MG tablet Commonly known as: MOBIC  Stopped by: Charlies Bellini   RyVent  6 MG Tabs Generic drug: Carbinoxamine  Maleate Stopped by: Charlies Bellini       TAKE these medications    albuterol  108 (90 Base) MCG/ACT inhaler Commonly known as: VENTOLIN  HFA Inhale 2 puffs into the lungs every 6 (six) hours as needed for wheezing or shortness of breath.   EPINEPHrine  0.3 mg/0.3 mL Soaj injection Commonly known as: Auvi-Q  Inject 0.3 mg into the muscle as needed for anaphylaxis.   Fish Oil 1000 MG Caps Take by mouth.   Klor-Con 20 MEQ packet Generic drug: potassium chloride Take 20 mEq by mouth daily.   levocetirizine 5 MG tablet Commonly known as: XYZAL  Take 1 tablet (5 mg total) by mouth daily.   MAGNESIUM GLYCINATE PO Take by mouth.   montelukast  10 MG tablet Commonly known as: SINGULAIR  Take 1 tablet (10 mg total) by mouth at bedtime.   Symbicort  160-4.5 MCG/ACT inhaler Generic drug: budesonide-formoterol Inhale 2 puffs into the lungs 2 (two) times daily.   triamcinolone  ointment 0.5 % Commonly known as: KENALOG  Apply 1 Application topically 2 (two) times daily as needed.   triamterene-hydrochlorothiazide 37.5-25 MG tablet Commonly known as: MAXZIDE-25 Take 1 tablet by mouth 2 (two) times daily.   VALACYCLOVIR HCL PO Take 1 tablet by mouth 2 (two) times daily.        All past medical  history, surgical history, allergies, family history, immunizations andmedications were updated in the EMR today and reviewed under the history and medication portions of their EMR.  Review of Systems  Constitutional:  Positive for malaise/fatigue. Negative for chills and fever.  HENT:  Positive for congestion, ear pain, sinus pain and sore throat.   Eyes: Negative.   Respiratory: Negative.    Cardiovascular: Negative.   Gastrointestinal: Negative.   Genitourinary: Negative.   Musculoskeletal: Negative.   Neurological:  Positive for headaches.   Negative, with the exception of above mentioned in HPI   Objective:  BP 114/76   Pulse 74   Temp 98.4 F (36.9 C)   Wt 249 lb (112.9 kg)   SpO2 97%   BMI 35.73 kg/m  Body mass index is 35.73 kg/m. Physical Exam Vitals and nursing note reviewed.  Constitutional:      General: She is not in acute distress.    Appearance: Normal appearance. She is normal weight. She is not ill-appearing or toxic-appearing.  HENT:     Head: Normocephalic and atraumatic.     Right Ear: Hearing and ear canal normal. A middle ear effusion is present. There is no impacted cerumen. No mastoid tenderness. Tympanic membrane is not injected, perforated or erythematous.     Left Ear: Hearing and ear canal normal. A middle ear effusion is present. There is no impacted cerumen. No mastoid tenderness. Tympanic membrane is not injected, perforated or erythematous.     Nose: Congestion and rhinorrhea present.     Right Turbinates: Swollen.     Left Turbinates: Swollen.     Right Sinus: Maxillary sinus tenderness present.     Left Sinus: Maxillary sinus tenderness present.     Comments: Bilateral erythema present    Mouth/Throat:     Mouth: Mucous membranes are moist.     Pharynx: No oropharyngeal exudate or posterior oropharyngeal erythema.  Eyes:     General: No scleral icterus.       Right eye: No discharge.        Left eye: No discharge.     Extraocular  Movements: Extraocular movements intact.     Conjunctiva/sclera: Conjunctivae normal.     Pupils: Pupils are equal, round, and reactive to light.  Cardiovascular:     Rate and Rhythm: Normal rate and regular rhythm.  Pulmonary:     Effort: Pulmonary effort is normal. No respiratory distress.     Breath sounds: Normal breath sounds. No wheezing, rhonchi or rales.  Musculoskeletal:     Cervical back: Neck supple.     Right lower leg: No edema.     Left lower leg: No edema.  Lymphadenopathy:     Cervical: Cervical adenopathy present.  Skin:    Findings: No rash.  Neurological:     Mental Status: She is alert and oriented to person, place, and time. Mental status is at baseline.     Motor: No weakness.     Coordination: Coordination normal.     Gait: Gait normal.  Psychiatric:        Mood and Affect: Mood normal.        Behavior: Behavior normal.        Thought Content: Thought content normal.        Judgment: Judgment normal.     No results found. No results found. No results found for this or any previous visit (from the past 24 hours).  Assessment/Plan: Caroline Gill is a 48 y.o. female present for OV for  Bacterial sinusitis/sinus pain/otalgia Rest, hydrate.  +/ flonase, nettie pot or nasal saline.  Omnicef  twice daily prescribed, take until completed.  Prednisone  20  mg daily x 5 days (patient cannot tolerate higher doses) Diflucan  prescribed from day 3 for maintenance infection F/U 2 weeks of not improved.   Reviewed expectations re: course of current medical issues. Discussed self-management of symptoms. Outlined signs and symptoms indicating need for more acute intervention. Patient verbalized understanding and all questions were answered. Patient received an After-Visit Summary.    No orders of the defined types were placed in this encounter.  No orders of the defined types were placed in this encounter.  Referral Orders  No referral(s) requested today      Note is dictated utilizing voice recognition software. Although note has been proof read prior to signing, occasional typographical errors still can be missed. If any questions arise, please do not hesitate to call for verification.   electronically signed by:  Charlies Bellini, DO  [facility] [place]

## 2024-01-04 NOTE — Progress Notes (Signed)
 Office Visit Note  Patient: Caroline Gill             Date of Birth: 10-09-1975           MRN: [id number]             PCP: Catherine Charlies LABOR, DO Referring: Catherine Charlies LABOR, DO Visit Date: 01/04/2024   Subjective:  Follow-up (Patient states about a month ago she went to pick up a cup and water and could not. Patient states she has had weakness and bad pain. Patient states she has pain and stiffness all over. Patient states she did just start a carnivore diet. )   Discussed the use of AI scribe software for clinical note transcription with the patient, who gave verbal consent to proceed.  History of Present Illness   Caroline Gill is a 48 year old female who presents with joint pain and inflammation.  She has been experiencing significant joint pain and inflammation since May, affecting her hands, ankles, knees, and other joints. The pain is severe enough to cause her distress and a desire to stay in bed, but it improves with activity. She describes the pain as 'flare ups' that vary in intensity.  Her symptoms improve when she follows a carnivore diet, consuming only meat, but worsen with the reintroduction of other foods. She has been on a carnivore diet for about a week, which has helped alleviate her symptoms. Previously, she was on a carnivore diet for a month, which completely relieved her pain.  She mentions a possible COVID-19 exposure in April but did not develop symptoms. Since then, she has experienced increased frequency of illness, as noted by her husband, and has had a recent sinus infection with lymphadenopathy. She was sick last week but feels better now.  She has gained weight and is attempting to lose it again. No rashes or noticeable swelling, but significant nasal inflammation has been noted by another healthcare provider. No mouth ulcers or sores.  She took one steroid before lunch to relieve severe ear pain. She has a history of a knee injury from high school,  resulting in cartilage damage, and has been told she may need a knee replacement in the future.         Previous HPI 03/18/22 [name] is a 48 y.o. female here for evaluation of joint and muscle pains with positive ANA and elevated inflammatory markers. These symptoms started pretty acutely over the course of a few days. She did not recall preceding illness or medical events. After visit with PCP office she started on meloxicam  and tried eliminating several items from her diet with a good improvement. After adopting a diet with only meat, eggs, and dairy after about a week. She stopped meloxicam  as well due to improvement in symptoms. She has had a few cheat meals including a variety of foods and noticed some pain and stiffness again for a few days. She has a history of significant allergies and was on allergy  shots in the past. She had some food allergens testing maybe about 5 years ago that was negative. Colonoscopy 1 year ago was fine, has tendency to diarrhea but chronic since gallbladder removal. Overall feeling well without complaints today.   Labs reviewed ANA pos 1:40 homogenous dsDNA, Scl-70, Sm, RNP, SSA, SSB neg RF neg CCP neg ESR 25 CRP 26.9 CK 44 Uric acid 5.4   Review of Systems  Constitutional:  Positive for fatigue.  HENT:  Positive for mouth dryness. Negative  for mouth sores.   Eyes:  Positive for dryness.  Respiratory:  Positive for shortness of breath.   Cardiovascular:  Positive for palpitations. Negative for chest pain.  Gastrointestinal:  Negative for blood in stool, constipation and diarrhea.  Endocrine: Negative for increased urination.  Genitourinary:  Negative for involuntary urination.  Musculoskeletal:  Positive for joint pain, gait problem, joint pain, myalgias, muscle weakness, morning stiffness and myalgias. Negative for joint swelling and muscle tenderness.  Skin:  Negative for color change, rash, hair loss and sensitivity to sunlight.   Allergic/Immunologic: Negative for susceptible to infections.  Neurological:  Positive for dizziness and headaches.  Hematological:  Negative for swollen glands.  Psychiatric/Behavioral:  Positive for sleep disturbance. Negative for depressed mood. The patient is not nervous/anxious.     PMFS History:  Patient Active Problem List   Diagnosis Date Noted   Close exposure to COVID-19 virus 09/23/2023   BMI 35-39.9, no comorbidity 04/08/2023   Positive ANA (antinuclear antibody) 03/18/2022   CRP elevated 03/18/2022   Polyarthralgia 01/23/2022   Leg cramps 01/07/2022   Allergic reaction 03/04/2020   Extrinsic asthma 03/04/2020   Seasonal and perennial allergic rhinitis 03/04/2020   Allergic contact dermatitis 03/04/2020   Family history of hypercoagulability 03/03/2013    Past Medical History:  Diagnosis Date   Arthritis    Asthma    Atrial fibrillation (HCC)    Chicken pox    Depression    Elevated WBC count 01/07/2022   Migraine    SVT (supraventricular tachycardia) (HCC)     Family History  Problem Relation Age of Onset   Skin cancer Mother    Asthma Mother    Osteoarthritis Mother    Thyroid  disease Mother    Diabetes Mother    Stroke Mother    Atrial fibrillation Mother    Alcohol abuse Mother    Depression Mother    Hypertension Father    Osteoarthritis Father    Diabetes Father    Atrial fibrillation Father    Stroke Father    Hypertension Brother    Osteoarthritis Brother    Alcohol abuse Brother    Asthma Brother    Drug abuse Brother    Skin cancer Brother    Hypertension Maternal Grandmother    Diabetes Maternal Grandmother    Depression Maternal Grandmother    Osteoarthritis Maternal Grandmother    Miscarriages / Stillbirths Maternal Grandmother    Bone cancer Maternal Grandfather        late 57s   Dementia Maternal Grandfather    Rheum arthritis Paternal Grandmother    Depression Paternal Grandmother    Migraines Paternal Grandmother    Heart  attack Paternal Grandfather 36   Depression Daughter    Anxiety disorder Daughter    ADD / ADHD Daughter    ADD / ADHD Son    Hypothyroidism Son    ADD / ADHD Son    Angioedema Neg Hx    Allergic rhinitis Neg Hx    Immunodeficiency Neg Hx    Urticaria Neg Hx    Eczema Neg Hx    Past Surgical History:  Procedure Laterality Date   BREAST BIOPSY  2020   CHOLECYSTECTOMY  2013   ENDOMETRIAL ABLATION  2013   KNEE ARTHROSCOPY Right 2018   meniscus   KNEE SURGERY Left 1992   dislox   SHOULDER ARTHROSCOPY  2018   Labral tear with anchors x3   VAGINAL DELIVERY     X3, 1996, 1999, 2002  Social History   Social History Narrative   Marital status/children/pets: Married.  3 children.   Education/employment: College-educated.  Owner/CFO-employed   Safety:      -Wears a bicycle helmet riding a bike: Yes     -smoke alarm in the home:Yes     - wears seatbelt: Yes     - Feels safe in their relationships: Yes      Immunization History  Administered Date(s) Administered   PFIZER(Purple Top)SARS-COV-2 Vaccination 12/06/2019, 01/12/2020   Pneumococcal Polysaccharide-23 07/29/2023   Tdap 01/07/2022     Objective: Vital Signs: BP 112/77 (BP Location: Left Arm, Patient Position: Sitting, Cuff Size: Large)   Pulse 84   Resp 14   Ht 5' 10.5 (1.791 m)   Wt 250 lb (113.4 kg)   BMI 35.36 kg/m    Physical Exam Eyes:     Conjunctiva/sclera: Conjunctivae normal.  Cardiovascular:     Rate and Rhythm: Normal rate and regular rhythm.  Pulmonary:     Effort: Pulmonary effort is normal.     Breath sounds: Normal breath sounds.  Musculoskeletal:     Right lower leg: No edema.     Left lower leg: No edema.  Lymphadenopathy:     Cervical: No cervical adenopathy.  Skin:    General: Skin is warm and dry.     Findings: No rash.  Neurological:     Mental Status: She is alert.  Psychiatric:        Mood and Affect: Mood normal.      Musculoskeletal Exam:  Shoulders full ROM no  tenderness or swelling Elbows full ROM no tenderness or swelling Wrists full ROM no tenderness or swelling Fingers full ROM no tenderness or swelling Hip normal internal and external rotation without pain, no tenderness to lateral hip palpation Knees full ROM no tenderness or swelling, L>R patellofemoral crepitus Ankles full ROM no tenderness or swelling   Investigation: No additional findings.  Imaging: No results found.  Recent Labs: Lab Results  Component Value Date   WBC 13.2 (H) 07/01/2023   HGB 13.3 07/01/2023   PLT 292 07/01/2023   NA 136 04/08/2023   K 4.3 04/08/2023   CL 101 04/08/2023   CO2 28 04/08/2023   GLUCOSE 105 (H) 04/08/2023   BUN 15 04/08/2023   CREATININE 0.73 04/08/2023   BILITOT 0.5 04/08/2023   ALKPHOS 136 (H) 04/08/2023   AST 14 04/08/2023   ALT 14 04/08/2023   PROT 7.6 04/08/2023   ALBUMIN 4.6 04/08/2023   CALCIUM 9.7 04/08/2023   GFRAA 124 03/13/2020    Speciality Comments: No specialty comments available.  Procedures:  No procedures performed Allergies: Oxycodone and Tapentadol   Assessment / Plan:     Visit Diagnoses: Positive ANA (antinuclear antibody) - Plan: Sedimentation rate, C-reactive protein, ANA, Anti-DNA antibody, double-stranded, C3 and C4 Polyarthralgia - Plan: Tissue transglutaminase, IgA, Gliadin antibodies, serum Chronic joint pain and stiffness in multiple joints, possibly linked to inflammatory arthritis or dietary-related inflammation. Symptoms improve with a carnivore diet but are unsustainable long-term.  Not much specific clinical criteria at present though there were findings documented in PCP encounter such as adenopathy. - Recheck lab tests for inflammation markers. - Discuss long-term management options, including dietary modifications versus medication if specific process identified. - Consider dietary management preference if symptoms are controlled.  Obesity Weight gain despite dietary changes. Current  carnivore diet alleviates joint pain but is unsustainable. - Encourage dietary modifications focusing on reducing carbohydrates.  Sinusitis Current sinus infection  with nasal inflammation.        Orders: Orders Placed This Encounter  Procedures   Sedimentation rate   C-reactive protein   ANA   Anti-DNA antibody, double-stranded   C3 and C4   Tissue transglutaminase, IgA   Gliadin antibodies, serum   Anti-nuclear ab-titer (ANA titer)   No orders of the defined types were placed in this encounter.    Follow-Up Instructions: Return if symptoms worsen or fail to improve.   Lonni LELON Ester, MD  Note - This record has been created using [employer].  Chart creation errors have been sought, but may not always  have been located. Such creation errors do not reflect on  the standard of medical care.

## 2024-01-11 LAB — GLIADIN ANTIBODIES, SERUM
Gliadin IgA: <1 U/mL
Gliadin IgG: <1 U/mL

## 2024-01-11 LAB — SEDIMENTATION RATE: Sed Rate: 22 mm/h — ABNORMAL HIGH (ref 0–20)

## 2024-01-11 LAB — ANTI-NUCLEAR AB-TITER (ANA TITER)
ANA TITER: 1:80 {titer} — ABNORMAL HIGH
ANA Titer 1: 1:80 {titer} — ABNORMAL HIGH

## 2024-01-11 LAB — ANTI-DNA ANTIBODY, DOUBLE-STRANDED: ds DNA Ab: 1 [IU]/mL

## 2024-01-11 LAB — C3 AND C4
C3 Complement: 173 mg/dL (ref 83–193)
C4 Complement: 24 mg/dL (ref 15–57)

## 2024-01-11 LAB — ANA: Anti Nuclear Antibody (ANA): POSITIVE — AB

## 2024-01-11 LAB — TISSUE TRANSGLUTAMINASE, IGA: (tTG) Ab, IgA: <1 U/mL

## 2024-01-11 LAB — C-REACTIVE PROTEIN: CRP: 22.4 mg/L — ABNORMAL HIGH (ref <8.0)

## 2024-01-17 ENCOUNTER — Encounter: Payer: Self-pay | Admitting: Internal Medicine

## 2024-01-31 ENCOUNTER — Ambulatory Visit: Payer: Self-pay

## 2024-01-31 NOTE — Telephone Encounter (Signed)
  FYI Only or Action Required?: Action required by provider: update on patient condition.  Patient was last seen in primary care on 01/04/2024 by Catherine Fuller A, DO.  Called Nurse Triage reporting Shoulder Pain.  Symptoms began today.  Interventions attempted: Nothing.  Symptoms are: gradually worsening.  Triage Disposition: Go to ED Now (Notify PCP)  Patient/caregiver understands and will follow disposition?: Yes Copied from CRM [id number]. Topic: Clinical - Red Word Triage >> Jan 31, 2024  4:20 PM Dedra B wrote: [facility] that prompted transfer to Nurse Triage: Pt is feeling weak and having pain in her L shoulder right past collarbone. Says she doesn't feel right and feels off. Warm transfer to [facility] nurse triage. Reason for Disposition  Difficulty breathing or unusual sweating (e.g., sweating without exertion)  Answer Assessment - Initial Assessment Questions 1. ONSET: When did the pain start?     Came home early from work two hours ago due to not feeling right 2. LOCATION: Where is the pain located?     Left shoulder pain, front of shoulder near collar bone 3. PAIN: How bad is the pain? (Scale 1-10; or mild, moderate, severe)     3/10 4. WORK OR EXERCISE: Has there been any recent work or exercise that involved this part of the body?     denies 5. CAUSE: What do you think is causing the shoulder pain?     unknown 6. OTHER SYMPTOMS: Do you have any other symptoms? (e.g., neck pain, swelling, rash, fever, numbness, weakness)    States that she has constant neck pain, but it is normally in the back 7. PREGNANCY: Is there any chance you are pregnant? When was your last menstrual period?     denies  Reports feeling tired and does not feel right Feels weak on standing, positive for nausea earlier today, admits to feeling cool and clammy Has palpitations, none more today than baseline, normally has tingling to left arm due to neck issues (no increased arm  discomfort) Denies dizziness, shortness of breath, denies back pain, denies radiating pain to arm, denies jaw pain, denies chest pain,  Protocols used: Shoulder Pain-A-AH

## 2024-02-01 NOTE — Telephone Encounter (Signed)
 No further action needed. Triage advised pt to go to ED

## 2024-02-08 ENCOUNTER — Encounter: Payer: Self-pay | Admitting: Allergy & Immunology

## 2024-02-08 ENCOUNTER — Ambulatory Visit: Admitting: Allergy & Immunology

## 2024-02-08 VITALS — BP 128/78 | HR 75 | Ht 70.5 in | Wt 248.0 lb

## 2024-02-08 DIAGNOSIS — L239 Allergic contact dermatitis, unspecified cause: Secondary | ICD-10-CM

## 2024-02-08 DIAGNOSIS — B999 Unspecified infectious disease: Secondary | ICD-10-CM | POA: Diagnosis not present

## 2024-02-08 DIAGNOSIS — R7982 Elevated C-reactive protein (CRP): Secondary | ICD-10-CM

## 2024-02-08 DIAGNOSIS — J302 Other seasonal allergic rhinitis: Secondary | ICD-10-CM

## 2024-02-08 DIAGNOSIS — J3089 Other allergic rhinitis: Secondary | ICD-10-CM | POA: Diagnosis not present

## 2024-02-08 DIAGNOSIS — T781XXD Other adverse food reactions, not elsewhere classified, subsequent encounter: Secondary | ICD-10-CM

## 2024-02-08 DIAGNOSIS — J4520 Mild intermittent asthma, uncomplicated: Secondary | ICD-10-CM | POA: Diagnosis not present

## 2024-02-08 MED ORDER — MONTELUKAST SODIUM 10 MG PO TABS: 10.0000 mg | ORAL_TABLET | Freq: Every day | ORAL | 4 refills | Status: AC

## 2024-02-08 MED ORDER — LEVOCETIRIZINE DIHYDROCHLORIDE 5 MG PO TABS: 5.0000 mg | ORAL_TABLET | Freq: Every day | ORAL | 4 refills | Status: AC

## 2024-02-08 NOTE — Progress Notes (Unsigned)
 FOLLOW UP  Date of Service/Encounter:  02/08/24   Assessment:   Allergic reaction - unclear trigger   Mild persistent asthma, uncomplicated - intermittently compliant with the regimen   Seasonal and perennial allergic rhinitis (grasses, weeds, ragweed, trees, dog) - was on allergen immunotherapy in the past with multiple large local reactions   Possible contact dermatitis (fragrance mix, paraben mix, gold, formaldehyde, Stress away essential oil)   Elevated CRP (followed by Dr. Lonni Ester with a negative workup)   Neutrophilia - followed by Hematology/Oncology (Dr. Kelby)   Supraventricular tachycardia    Plan/Recommendations:   There are no Patient Instructions on file for this visit.   Subjective:   Caroline Gill is a 48 y.o. female presenting today for follow up of  Chief Complaint  Patient presents with  . Seasonal and perennial allergic rhinitis    Caroline Gill has a history of the following: Patient Active Problem List   Diagnosis Date Noted  . Close exposure to COVID-19 virus 09/23/2023  . BMI 35-39.9, no comorbidity 04/08/2023  . Positive ANA (antinuclear antibody) 03/18/2022  . CRP elevated 03/18/2022  . Polyarthralgia 01/23/2022  . Leg cramps 01/07/2022  . Allergic reaction 03/04/2020  . Extrinsic asthma 03/04/2020  . Seasonal and perennial allergic rhinitis 03/04/2020  . Allergic contact dermatitis 03/04/2020  . Family history of hypercoagulability 03/03/2013    History obtained from: chart review and {Persons; PED relatives w/patient:19415::patient}.  Discussed the use of AI scribe software for clinical note transcription with the patient and/or guardian, who gave verbal consent to proceed.  Caroline Gill is a 48 y.o. female presenting for {Blank single:19197::a food challenge,a drug challenge,skin testing,a sick visit,an evaluation of ***,a follow up visit}.  Asthma/Respiratory Symptom History: ***  Allergic Rhinitis  Symptom History: ***  Food Allergy  Symptom History: ***  Skin Symptom History: ***  GERD Symptom History: ***  Infection Symptom History: ***  Otherwise, there have been no changes to her past medical history, surgical history, family history, or social history.    Review of systems otherwise negative other than that mentioned in the HPI.    Objective:   Blood pressure 128/78, pulse 75, height 5' 10.5 (1.791 m), weight 248 lb (112.5 kg), SpO2 97%. Body mass index is 35.08 kg/m.    Physical Exam   Diagnostic studies: {Blank single:19197::none,deferred due to recent antihistamine use,deferred due to insurance stipulations that require a separate visit for testing,labs sent instead, }  Spirometry: {Blank single:19197::results normal (FEV1: ***%, FVC: ***%, FEV1/FVC: ***%),results abnormal (FEV1: ***%, FVC: ***%, FEV1/FVC: ***%)}.    {Blank single:19197::Spirometry consistent with mild obstructive disease,Spirometry consistent with moderate obstructive disease,Spirometry consistent with severe obstructive disease,Spirometry consistent with possible restrictive disease,Spirometry consistent with mixed obstructive and restrictive disease,Spirometry uninterpretable due to technique,Spirometry consistent with normal pattern}. {Blank single:19197::Albuterol /Atrovent  nebulizer,Xopenex/Atrovent  nebulizer,Albuterol  nebulizer,Albuterol  four puffs via MDI,Xopenex four puffs via MDI} treatment given in clinic with {Blank single:19197::significant improvement in FEV1 per ATS criteria,significant improvement in FVC per ATS criteria,significant improvement in FEV1 and FVC per ATS criteria,improvement in FEV1, but not significant per ATS criteria,improvement in FVC, but not significant per ATS criteria,improvement in FEV1 and FVC, but not significant per ATS criteria,no improvement}.  Allergy  Studies: {Blank single:19197::none,deferred due to  recent antihistamine use,deferred due to insurance stipulations that require a separate visit for testing,labs sent instead, }    {Blank single:19197::Allergy  testing results were read and interpreted by myself, documented by clinical staff., }      Marty Shaggy, MD  [facility] 

## 2024-02-08 NOTE — Patient Instructions (Addendum)
 Adverse food reaction - stable - Continue with your same diet.    2. Elevated CRP - Last time your CRP was normal. - Continue to see Dr. Jeannetta.   3. Seasonal and perennial allergic rhinitis (grasses, ragweed, weeds, trees and dog) - Continue with: Xyzal  (levocetirizine) 5mg  tablet 1-2 times daily and Singulair  (montelukast ) 10mg  daily  - I sent in 90-day  - We are going to get repeat Streptococcal titers since you got your Pneumovax in February 2025 (GREAT JOB).   4. Possible contact dermatitis (fragrance mix, paraben mix, gold, formaldehyde, Stress away essential oil) - Avoid triggers as tolerated.  - Continue to moisturize with Honest Lotion.  5. Mild persistent asthma, uncomplicated  - Lung testing looks great today.  - I think you have a good handle on your symptoms.  - Daily controller medication(s): Singulair  10mg  daily - Prior to physical activity: albuterol  2 puffs 10-15 minutes before physical activity. - Rescue medications: albuterol  4 puffs every 4-6 hours as needed - Changes during respiratory infections or worsening symptoms: Add on Symbicort  160/4.27mcg to 2 puffs twice daily for ONE TO TWO WEEKS. - Asthma control goals:  * Full participation in all desired activities (may need albuterol  before activity) * Albuterol  use two time or less a week on average (not counting use with activity) * Cough interfering with sleep two time or less a month * Oral steroids no more than once a year * No hospitalizations  6. Return in about 6 months (around 08/10/2024). You can have the follow up appointment with Dr. Iva or a Nurse Practicioner (our Nurse Practitioners are excellent and always have Physician oversight!).    Please inform us  of any Emergency Department visits, hospitalizations, or changes in symptoms. Call us  before going to the ED for breathing or allergy  symptoms since we might be able to fit you in for a sick visit. Feel free to contact us  anytime with any  questions, problems, or concerns.  It was a pleasure to see you again today!  Websites that have reliable patient information: 1. American Academy of Asthma, Allergy , and Immunology: www.aaaai.org 2. Food Allergy  Research and Education (FARE): foodallergy.org 3. Mothers of Asthmatics: http://www.asthmacommunitynetwork.org 4. American College of Allergy , Asthma, and Immunology: www.acaai.org      "Like" us  on Facebook and Instagram for our latest updates!      A healthy democracy works best when [employer] participate! Make sure you are registered to vote! If you have moved or changed any of your contact information, you will need to get this updated before voting! Scan the QR codes below to learn more!

## 2024-02-10 ENCOUNTER — Encounter: Payer: Self-pay | Admitting: Allergy & Immunology

## 2024-02-12 LAB — STREP PNEUMONIAE 23 SEROTYPES IGG
Pneumo Ab Type 1*: 5.1 ug/mL (ref >1.3)
Pneumo Ab Type 12 (12F)*: 2 ug/mL (ref >1.3)
Pneumo Ab Type 14*: 2.1 ug/mL (ref >1.3)
Pneumo Ab Type 17 (17F)*: 2.1 ug/mL (ref >1.3)
Pneumo Ab Type 19 (19F)*: 2.3 ug/mL (ref >1.3)
Pneumo Ab Type 2*: 4.4 ug/mL (ref >1.3)
Pneumo Ab Type 20*: 2.5 ug/mL (ref >1.3)
Pneumo Ab Type 22 (22F)*: 0.9 ug/mL — ABNORMAL LOW (ref >1.3)
Pneumo Ab Type 23 (23F)*: 0.2 ug/mL — ABNORMAL LOW (ref >1.3)
Pneumo Ab Type 26 (6B)*: 3.9 ug/mL (ref >1.3)
Pneumo Ab Type 3*: <0.1 ug/mL — ABNORMAL LOW (ref >1.3)
Pneumo Ab Type 34 (10A)*: 5 ug/mL (ref >1.3)
Pneumo Ab Type 4*: 0.1 ug/mL — ABNORMAL LOW (ref >1.3)
Pneumo Ab Type 43 (11A)*: 1.2 ug/mL — ABNORMAL LOW (ref >1.3)
Pneumo Ab Type 5*: 0.1 ug/mL — ABNORMAL LOW (ref >1.3)
Pneumo Ab Type 51 (7F)*: 0.3 ug/mL — ABNORMAL LOW (ref >1.3)
Pneumo Ab Type 54 (15B)*: 36.2 ug/mL (ref >1.3)
Pneumo Ab Type 56 (18C)*: 2.6 ug/mL (ref >1.3)
Pneumo Ab Type 57 (19A)*: 5 ug/mL (ref >1.3)
Pneumo Ab Type 68 (9V)*: 0.9 ug/mL — ABNORMAL LOW (ref >1.3)
Pneumo Ab Type 70 (33F)*: 5.7 ug/mL (ref >1.3)
Pneumo Ab Type 8*: 15.6 ug/mL (ref >1.3)
Pneumo Ab Type 9 (9N)*: 0.2 ug/mL — ABNORMAL LOW (ref >1.3)

## 2024-02-18 ENCOUNTER — Ambulatory Visit: Payer: Self-pay | Admitting: Allergy & Immunology

## 2024-03-07 ENCOUNTER — Encounter: Payer: Self-pay | Admitting: Family Medicine

## 2024-03-07 ENCOUNTER — Ambulatory Visit (INDEPENDENT_AMBULATORY_CARE_PROVIDER_SITE_OTHER): Admitting: Family Medicine

## 2024-03-07 VITALS — BP 122/84 | HR 92 | Temp 98.5°F | Wt 245.2 lb

## 2024-03-07 DIAGNOSIS — H6993 Unspecified Eustachian tube disorder, bilateral: Secondary | ICD-10-CM

## 2024-03-07 NOTE — Progress Notes (Signed)
 Caroline Gill , 1975-12-10, 48 y.o., female MRN: [id number] Patient Care Team    Relationship Specialty Notifications Start End  Catherine Charlies LABOR, DO PCP - General Family Medicine  01/07/22   Euna Read, MD Consulting Physician Neurology  01/07/22   Mabel PARAS. Janan, MD Consulting Physician Cardiology  01/07/22   Kelby Melvern PENNER, MD Referring Physician Hematology and Oncology  01/07/22   Romilda Shove T  Gastroenterology  01/07/22    Comment: [facility]  Javaid, Adnan, MD Referring Physician Pulmonary Disease  01/07/22   Iva Marty Saltness, MD Consulting Physician Allergy  and Immunology  08/05/23   Leonce Katz, DO Consulting Physician Sports Medicine  08/05/23   Jeannetta Lonni ORN, MD Consulting Physician Rheumatology  08/05/23     Chief Complaint  Patient presents with   Ear Pain    Ongoing ear pain in both ears. Tender to the touch. Pt has not tried anything.      Subjective: Caroline Gill is a 48 y.o. Pt presents for an OV with complaints of b/l ear pain.  Patient reports symptoms started to go with intermittent pain.  She noticed some sharp pain in her ears that had her concerned if she had an ear infection.  She is taking Xyzal  and Singulair .  She only uses the Flonase or Nasacort  as needed, it does make her a nosebleed if used too frequently.  She denies any fevers or chills.  No associated new respiratory symptoms.   Prior note 12/2023 Left  ear and sinus pain of 1 week duration.  Associated symptoms include congestion and weakness. Patient reports she had bilateral ear infections status post COVID exposure in April.  Treated with Augmentin at that time.  She never feels left ear returned to normal after that infection. Pt has tried OTC decongestant to ease their symptoms.  ROS below     04/08/2023    8:13 AM 04/06/2022    8:45 AM 01/07/2022   10:02 AM  Depression screen PHQ 2/9  Decreased Interest 1 0 3  Down, Depressed, Hopeless 1 0 1  PHQ - 2 Score 2 0 4   Altered sleeping  1   Tired, decreased energy  1   Change in appetite  0   Feeling bad or failure about yourself  3 1   Trouble concentrating 1 1   Moving slowly or fidgety/restless 0 0   Suicidal thoughts 0 0   PHQ-9 Score  4   Difficult doing work/chores Somewhat difficult      Allergies  Allergen Reactions   Oxycodone Other (See Comments)    Severe headache Severe headache Severe headache   Tapentadol Rash   Tape    Social History   Social History Narrative   Marital status/children/pets: Married.  3 children.   Education/employment: College-educated.  Owner/CFO-employed   Safety:      -Wears a bicycle helmet riding a bike: Yes     -smoke alarm in the home:Yes     - wears seatbelt: Yes     - Feels safe in their relationships: Yes      Past Medical History:  Diagnosis Date   Arthritis    Asthma    Atrial fibrillation (HCC)    Chicken pox    Depression    Elevated WBC count 01/07/2022   Migraine    SVT (supraventricular tachycardia)    Past Surgical History:  Procedure Laterality Date   BREAST BIOPSY  2020   CHOLECYSTECTOMY  2013  ENDOMETRIAL ABLATION  2013   KNEE ARTHROSCOPY Right 2018   meniscus   KNEE SURGERY Left 1992   dislox   SHOULDER ARTHROSCOPY  2018   Labral tear with anchors x3   VAGINAL DELIVERY     X3, 1996, 1999, 2002   Family History  Problem Relation Age of Onset   Skin cancer Mother    Asthma Mother    Osteoarthritis Mother    Thyroid  disease Mother    Diabetes Mother    Stroke Mother    Atrial fibrillation Mother    Alcohol abuse Mother    Depression Mother    Hypertension Father    Osteoarthritis Father    Diabetes Father    Atrial fibrillation Father    Stroke Father    Hypertension Brother    Osteoarthritis Brother    Alcohol abuse Brother    Asthma Brother    Drug abuse Brother    Skin cancer Brother    Hypertension Maternal Grandmother    Diabetes Maternal Grandmother    Depression Maternal Grandmother     Osteoarthritis Maternal Grandmother    Miscarriages / Stillbirths Maternal Grandmother    Bone cancer Maternal Grandfather        late 64s   Dementia Maternal Grandfather    Rheum arthritis Paternal Grandmother    Depression Paternal Grandmother    Migraines Paternal Grandmother    Heart attack Paternal Grandfather 62   Depression Daughter    Anxiety disorder Daughter    ADD / ADHD Daughter    ADD / ADHD Son    Hypothyroidism Son    ADD / ADHD Son    Angioedema Neg Hx    Allergic rhinitis Neg Hx    Immunodeficiency Neg Hx    Urticaria Neg Hx    Eczema Neg Hx    Allergies as of 03/07/2024       Reactions   Oxycodone Other (See Comments)   Severe headache Severe headache Severe headache   Tapentadol Rash   Tape         Medication List        Accurate as of March 07, 2024  2:51 PM. If you have any questions, ask your nurse or doctor.          albuterol  108 (90 Base) MCG/ACT inhaler Commonly known as: VENTOLIN  HFA Inhale 2 puffs into the lungs every 6 (six) hours as needed for wheezing or shortness of breath.   EPINEPHrine  0.3 mg/0.3 mL Soaj injection Commonly known as: Auvi-Q  Inject 0.3 mg into the muscle as needed for anaphylaxis.   Fish Oil 1000 MG Caps Take by mouth.   Klor-Con 20 MEQ packet Generic drug: potassium chloride Take 20 mEq by mouth daily.   levocetirizine 5 MG tablet Commonly known as: XYZAL  Take 1 tablet (5 mg total) by mouth daily.   MAGNESIUM GLYCINATE PO Take by mouth.   montelukast  10 MG tablet Commonly known as: SINGULAIR  Take 1 tablet (10 mg total) by mouth at bedtime.   MULTIVITAMIN PO Take by mouth.   Symbicort  160-4.5 MCG/ACT inhaler Generic drug: budesonide-formoterol Inhale 2 puffs into the lungs 2 (two) times daily.   triamcinolone  ointment 0.5 % Commonly known as: KENALOG  Apply 1 Application topically 2 (two) times daily as needed.   triamterene-hydrochlorothiazide 37.5-25 MG tablet Commonly known as:  MAXZIDE-25 Take 1 tablet by mouth 2 (two) times daily.   TURMERIC PO Take by mouth.        All past medical history,  surgical history, allergies, family history, immunizations andmedications were updated in the EMR today and reviewed under the history and medication portions of their EMR.     ROS Negative, with the exception of above mentioned in HPI   Objective:  BP 122/84   Pulse 92   Temp 98.5 F (36.9 C)   Wt 245 lb 3.2 oz (111.2 kg)   SpO2 98%   BMI 34.69 kg/m  Body mass index is 34.69 kg/m. Physical Exam Vitals and nursing note reviewed.  Constitutional:      General: She is not in acute distress.    Appearance: Normal appearance. She is normal weight. She is not ill-appearing or toxic-appearing.  HENT:     Head: Normocephalic and atraumatic.     Right Ear: Ear canal and external ear normal. A middle ear effusion is present. There is no impacted cerumen. No mastoid tenderness. Tympanic membrane is not erythematous, retracted or bulging.     Left Ear: Ear canal and external ear normal. A middle ear effusion is present. There is no impacted cerumen. No mastoid tenderness. Tympanic membrane is not erythematous, retracted or bulging.     Nose:     Right Turbinates: Not swollen.     Left Turbinates: Not swollen.     Right Sinus: No maxillary sinus tenderness or frontal sinus tenderness.     Left Sinus: No maxillary sinus tenderness or frontal sinus tenderness.     Mouth/Throat:     Lips: Pink.     Pharynx: Oropharynx is clear.     Tonsils: No tonsillar exudate or tonsillar abscesses.  Eyes:     General: No scleral icterus.       Right eye: No discharge.        Left eye: No discharge.     Extraocular Movements: Extraocular movements intact.     Conjunctiva/sclera: Conjunctivae normal.     Pupils: Pupils are equal, round, and reactive to light.  Skin:    Findings: No rash.  Neurological:     Mental Status: She is alert and oriented to person, place, and time.  Mental status is at baseline.     Motor: No weakness.     Coordination: Coordination normal.     Gait: Gait normal.  Psychiatric:        Mood and Affect: Mood normal.        Behavior: Behavior normal.        Thought Content: Thought content normal.        Judgment: Judgment normal.     No results found. No results found. No results found for this or any previous visit (from the past 24 hours).  Assessment/Plan: Caroline Gill is a 48 y.o. female present for OV for  otalgia/she states seem to Rest, hydrate.  flonase, nettie pot or nasal saline. Continue Xyzal  and Singulair  Discussed Galbreath maneuver Advil as needed for pain. Follow-up as needed   Reviewed expectations re: course of current medical issues. Discussed self-management of symptoms. Outlined signs and symptoms indicating need for more acute intervention. Patient verbalized understanding and all questions were answered. Patient received an After-Visit Summary.    No orders of the defined types were placed in this encounter.  No orders of the defined types were placed in this encounter.  Referral Orders  No referral(s) requested today     Note is dictated utilizing voice recognition software. Although note has been proof read prior to signing, occasional typographical errors still can be missed. If any questions  arise, please do not hesitate to call for verification.   electronically signed by:  Charlies Bellini, DO  [facility] [place]

## 2024-03-07 NOTE — Patient Instructions (Signed)
         Great to see you today.  I have refilled the medication(s) we provide.   If labs were collected or images ordered, we will inform you of  results once we have received them and reviewed. We will contact you either by echart message, or telephone call.  Please give ample time to the testing facility, and our office to run,  receive and review results. Please do not call inquiring of results, even if you can see them in your chart. We will contact you as soon as we are able. If it has been over 1 week since the test was completed, and you have not yet heard from us , then please call us .    - echart message- for normal results that have been seen by the patient already.   - telephone call: abnormal results or if patient has not viewed results in their echart.  If a referral to a specialist was entered for you, please call us  in 2 weeks if you have not heard from the specialist office to schedule.

## 2024-03-23 ENCOUNTER — Telehealth: Payer: Self-pay

## 2024-03-23 DIAGNOSIS — H6993 Unspecified Eustachian tube disorder, bilateral: Secondary | ICD-10-CM

## 2024-03-23 NOTE — Telephone Encounter (Signed)
 Did the patient discuss referral with their provider in the last year? Yes  (If No - schedule appointment)  (If Yes - send message)  Appointment offered? No  Type of order/referral and detailed reason for visit: Patient had an appointment with Dr. Catherine in regards to the pain she was experiencing in her ears. They discussed putting the referral in but it was not done.  Preference of office, provider, location: No preference.  If referral order, have you been seen by this specialty before? No  (If Yes, this issue or another issue? When? Where?  Can we respond through MyChart? Yes

## 2024-03-26 NOTE — Telephone Encounter (Signed)
 Referral placed- please make patient aware

## 2024-03-27 LAB — HM MAMMOGRAPHY

## 2024-03-27 NOTE — Telephone Encounter (Signed)
Pt advised via my chart.

## 2024-04-04 ENCOUNTER — Encounter (INDEPENDENT_AMBULATORY_CARE_PROVIDER_SITE_OTHER): Payer: Self-pay | Admitting: Physician Assistant

## 2024-04-04 ENCOUNTER — Ambulatory Visit (INDEPENDENT_AMBULATORY_CARE_PROVIDER_SITE_OTHER): Admitting: Physician Assistant

## 2024-04-04 VITALS — BP 111/75 | HR 75 | Ht 70.5 in | Wt 240.0 lb

## 2024-04-04 DIAGNOSIS — H6993 Unspecified Eustachian tube disorder, bilateral: Secondary | ICD-10-CM

## 2024-04-04 DIAGNOSIS — H9203 Otalgia, bilateral: Secondary | ICD-10-CM

## 2024-04-04 DIAGNOSIS — M26609 Unspecified temporomandibular joint disorder, unspecified side: Secondary | ICD-10-CM

## 2024-04-04 NOTE — Progress Notes (Signed)
 Dear Dr. Catherine, Here is my assessment for our mutual patient, Caroline Gill. Thank you for allowing me the opportunity to care for your patient. Please do not hesitate to contact me should you have any other questions. Sincerely, Chyrl Cohen PA-C  Otolaryngology Clinic Note Referring provider: Dr. Catherine HPI:  Caroline Gill is a 48 y.o. female kindly referred by Dr. Catherine   The patient is a 48 year old female seen in our office for evaluation of ear related complaints.  The patient notes a remote history of the same, she notes that in March she was flying back from [place]  and felt severe pain in her left ear.  She notes she was diagnosed with acute otitis media.  She notes that the symptoms have worsened over the last 1.5 months with pressure and pain in both the ears.  She denies any clicking or popping.  She does note some [place].  Feels like her hearing is normal, she has some tonal tinnitus in bilateral ears.  She describes the discomfort as pulsing and pain like.  She denies any history of severe recurrent infections when she was younger, no history tympanostomy tubes.  She does note that she had strep 7 times in 1 year as a child.  She denies any trauma or surgery to the ears.  She notes year-round allergies and uses Xyzal  and Singulair .  She saw her primary care approximately 1 month ago and started using Flonase/Nasacort  daily which has not helped her symptoms.  She is also using daily saline spray.  Notes she has seen an allergist previously and attempted allergy  shots but had an allergic reaction to this.  She also notes a history of grinding her teeth, she notes cracking in her left jaw and has been under a lot of stress lately.  She notes she uses a retainer at night.   Independent Review of Additional Tests or Records:  None   PMH/Meds/All/SocHx/FamHx/ROS:   Past Medical History:  Diagnosis Date   Arthritis    Asthma    Atrial fibrillation  (HCC)    Chicken pox    Depression    Elevated WBC count 01/07/2022   Migraine    SVT (supraventricular tachycardia)      Past Surgical History:  Procedure Laterality Date   BREAST BIOPSY  2020   CHOLECYSTECTOMY  2013   ENDOMETRIAL ABLATION  2013   KNEE ARTHROSCOPY Right 2018   meniscus   KNEE SURGERY Left 1992   dislox   SHOULDER ARTHROSCOPY  2018   Labral tear with anchors x3   VAGINAL DELIVERY     X3, 1996, 1999, 2002    Family History  Problem Relation Age of Onset   Skin cancer Mother    Asthma Mother    Osteoarthritis Mother    Thyroid  disease Mother    Diabetes Mother    Stroke Mother    Atrial fibrillation Mother    Alcohol abuse Mother    Depression Mother    Hypertension Father    Osteoarthritis Father    Diabetes Father    Atrial fibrillation Father    Stroke Father    Hypertension Brother    Osteoarthritis Brother    Alcohol abuse Brother    Asthma Brother    Drug abuse Brother    Skin cancer Brother    Hypertension Maternal Grandmother    Diabetes Maternal Grandmother    Depression Maternal Grandmother    Osteoarthritis Maternal Grandmother    Miscarriages /  Stillbirths Maternal Grandmother    Bone cancer Maternal Grandfather        late 57s   Dementia Maternal Grandfather    Rheum arthritis Paternal Grandmother    Depression Paternal Grandmother    Migraines Paternal Grandmother    Heart attack Paternal Grandfather 72   Depression Daughter    Anxiety disorder Daughter    ADD / ADHD Daughter    ADD / ADHD Son    Hypothyroidism Son    ADD / ADHD Son    Angioedema Neg Hx    Allergic rhinitis Neg Hx    Immunodeficiency Neg Hx    Urticaria Neg Hx    Eczema Neg Hx      Social Connections: Moderately Integrated (02/03/2024)   Received from [facility]   Social Network    How would you rate your social network (family, work, friends)?: Adequate participation with social networks      Current Outpatient Medications:    albuterol   (VENTOLIN  HFA) 108 (90 Base) MCG/ACT inhaler, Inhale 2 puffs into the lungs every 6 (six) hours as needed for wheezing or shortness of breath., Disp: 18 g, Rfl: 2   EPINEPHrine  (AUVI-Q ) 0.3 mg/0.3 mL IJ SOAJ injection, Inject 0.3 mg into the muscle as needed for anaphylaxis., Disp: 1 each, Rfl: 1   KLOR-CON 20 MEQ packet, Take 20 mEq by mouth daily., Disp: , Rfl:    levocetirizine (XYZAL ) 5 MG tablet, Take 1 tablet (5 mg total) by mouth daily., Disp: 90 tablet, Rfl: 4   MAGNESIUM GLYCINATE PO, Take by mouth., Disp: , Rfl:    montelukast  (SINGULAIR ) 10 MG tablet, Take 1 tablet (10 mg total) by mouth at bedtime., Disp: 90 tablet, Rfl: 4   Multiple Vitamin (MULTIVITAMIN PO), Take by mouth., Disp: , Rfl:    Omega-3 Fatty Acids (FISH OIL) 1000 MG CAPS, Take by mouth., Disp: , Rfl:    SYMBICORT  160-4.5 MCG/ACT inhaler, Inhale 2 puffs into the lungs 2 (two) times daily., Disp: 10.2 g, Rfl: 5   triamcinolone  ointment (KENALOG ) 0.5 %, Apply 1 Application topically 2 (two) times daily as needed., Disp: 30 g, Rfl: 5   triamterene-hydrochlorothiazide (MAXZIDE-25) 37.5-25 MG tablet, Take 1 tablet by mouth 2 (two) times daily., Disp: , Rfl:    TURMERIC PO, Take by mouth., Disp: , Rfl:    Physical Exam:   BP 111/75 (BP Location: Right Arm, Patient Position: Sitting)   Pulse 75   Ht 5' 10.5 (1.791 m)   Wt 240 lb (108.9 kg)   SpO2 96%   BMI 33.95 kg/m   Pertinent Findings  CN II-XII intact Bilateral EAC clear and TM intact with well pneumatized middle ear spaces Anterior rhinoscopy: Septum midline; bilateral inferior turbinates with mild hypertrophy No lesions of oral cavity/oropharynx; dentition is in normal limits, left TMJ crepitus No obviously palpable neck masses/lymphadenopathy/thyromegaly No respiratory distress or stridor  Seprately Identifiable Procedures:  None  Impression & Plans:  Falicity Sheets is a 48 y.o. female with the following   Ear pain-  Patient symptoms are most consistent  with eustachian tube dysfunction.  She describes some pressure related changes and discomfort.  She had acute otitis media in March.  I would recommend continued use of Xyzal , Flonase, saline irrigation, ear popping.  I would recommend a audiological evaluation in 2 months after 3 months of medical management.  The patient also has TMJ disorder which could be contributing to some of her symptoms but not necessarily all of them.  I  discussed that if she has normal audiological evaluation the likelihood of any surgical intervention would be unsuccessful.  Once the audio is completed I will see her in the office and we will discuss plan moving forward.  The patient was given return precautions.  She verbalized understanding and agreement to today's plan.   - f/u 2 months with audiological evaluation and office visit   Thank you for allowing me the opportunity to care for your patient. Please do not hesitate to contact me should you have any other questions.  Sincerely, Chyrl Cohen PA-C [facility] Phone: [phone] Fax: [phone]  04/04/2024, 10:46 AM

## 2024-04-06 ENCOUNTER — Encounter (INDEPENDENT_AMBULATORY_CARE_PROVIDER_SITE_OTHER): Payer: Self-pay

## 2024-04-11 ENCOUNTER — Encounter: Payer: 59 | Admitting: Family Medicine

## 2024-05-09 ENCOUNTER — Ambulatory Visit: Admitting: Audiology

## 2024-05-15 ENCOUNTER — Encounter: Payer: Self-pay | Admitting: Family Medicine

## 2024-05-15 ENCOUNTER — Telehealth: Admitting: Family Medicine

## 2024-05-15 DIAGNOSIS — J208 Acute bronchitis due to other specified organisms: Secondary | ICD-10-CM

## 2024-05-15 DIAGNOSIS — R051 Acute cough: Secondary | ICD-10-CM | POA: Diagnosis not present

## 2024-05-15 DIAGNOSIS — B9689 Other specified bacterial agents as the cause of diseases classified elsewhere: Secondary | ICD-10-CM

## 2024-05-15 MED ORDER — AZITHROMYCIN 250 MG PO TABS: ORAL_TABLET | ORAL | 0 refills | Status: AC

## 2024-05-15 MED ORDER — PREDNISONE 20 MG PO TABS: 20.0000 mg | ORAL_TABLET | Freq: Every day | ORAL | 0 refills | Status: AC

## 2024-05-15 MED ORDER — PROMETHAZINE-DM 6.25-15 MG/5ML PO SYRP: 5.0000 mL | ORAL_SOLUTION | Freq: Four times a day (QID) | ORAL | 0 refills | Status: DC | PRN

## 2024-05-15 MED ORDER — BENZONATATE 100 MG PO CAPS: 100.0000 mg | ORAL_CAPSULE | Freq: Three times a day (TID) | ORAL | 0 refills | Status: DC | PRN

## 2024-05-15 NOTE — Progress Notes (Signed)
 Virtual Visit Consent   Caroline Gill, you are scheduled for a virtual visit with a [facility] provider today. Just as with appointments in the office, your consent must be obtained to participate. Your consent will be active for this visit and any virtual visit you may have with one of our providers in the next 365 days. If you have a MyChart account, a copy of this consent can be sent to you electronically.  As this is a virtual visit, video technology does not allow for your provider to perform a traditional examination. This may limit your provider's ability to fully assess your condition. If your provider identifies any concerns that need to be evaluated in person or the need to arrange testing (such as labs, EKG, etc.), we will make arrangements to do so. Although advances in technology are sophisticated, we cannot ensure that it will always work on either your end or our end. If the connection with a video visit is poor, the visit may have to be switched to a telephone visit. With either a video or telephone visit, we are not always able to ensure that we have a secure connection.  By engaging in this virtual visit, you consent to the provision of healthcare and authorize for your insurance to be billed (if applicable) for the services provided during this visit. Depending on your insurance coverage, you may receive a charge related to this service.  I need to obtain your verbal consent now. Are you willing to proceed with your visit today? Caroline Gill has provided verbal consent on 05/15/2024 for a virtual visit (video or telephone). Caroline CHRISTELLA Barefoot, NP  Date: 05/15/2024 9:43 AM   Virtual Visit via Video Note   I, Caroline Gill, connected with  Caroline Gill  ([id number], [birth year]) on 05/15/24 at  9:45 AM EST by a video-enabled telemedicine application and verified that I am speaking with the correct person using two identifiers.  Location: Patient: Virtual Visit Location Patient:  Home Provider: Virtual Visit Location Provider: Home Office   I discussed the limitations of evaluation and management by telemedicine and the availability of in person appointments. The patient expressed understanding and agreed to proceed.    History of Present Illness: Caroline Gill is a 48 y.o. who identifies as a female who was assigned female at birth, and is being seen today for cough  Was having ear trouble- and did a sinus rinse on last Tuesday- and thought that it might have triggered a sore throat, and mucus, and cough that has continued to worsen over the week. Moving more into chest. Feeling achy and overall not feeling well. Mucus is not yellow to green- and coughing up same color. Congestion is impacting sleep. Cough as well.  Modifying factors are flonase/ nascort rotation, xyzal  and sinuglar, inhaler- mucinex Denies chest pain, shortness of breath, fevers, chills  Exposure to sick contacts- unknown COVID test: no Vaccines: none  Problems:  Patient Active Problem List   Diagnosis Date Noted   Close exposure to COVID-19 virus 09/23/2023   BMI 35-39.9, no comorbidity 04/08/2023   Positive ANA (antinuclear antibody) 03/18/2022   CRP elevated 03/18/2022   Polyarthralgia 01/23/2022   Leg cramps 01/07/2022   Allergic reaction 03/04/2020   Extrinsic asthma 03/04/2020   Seasonal and perennial allergic rhinitis 03/04/2020   Allergic contact dermatitis 03/04/2020   Family history of hypercoagulability 03/03/2013    Allergies:  Allergies  Allergen Reactions   Oxycodone Other (See Comments)    Severe headache  Severe headache Severe headache   Tapentadol Rash   Tape    Medications:  Current Outpatient Medications:    albuterol  (VENTOLIN  HFA) 108 (90 Base) MCG/ACT inhaler, Inhale 2 puffs into the lungs every 6 (six) hours as needed for wheezing or shortness of breath., Disp: 18 g, Rfl: 2   EPINEPHrine  (AUVI-Q ) 0.3 mg/0.3 mL IJ SOAJ injection, Inject 0.3 mg into the  muscle as needed for anaphylaxis., Disp: 1 each, Rfl: 1   KLOR-CON 20 MEQ packet, Take 20 mEq by mouth daily., Disp: , Rfl:    levocetirizine (XYZAL ) 5 MG tablet, Take 1 tablet (5 mg total) by mouth daily., Disp: 90 tablet, Rfl: 4   MAGNESIUM GLYCINATE PO, Take by mouth., Disp: , Rfl:    montelukast  (SINGULAIR ) 10 MG tablet, Take 1 tablet (10 mg total) by mouth at bedtime., Disp: 90 tablet, Rfl: 4   Multiple Vitamin (MULTIVITAMIN PO), Take by mouth., Disp: , Rfl:    Omega-3 Fatty Acids (FISH OIL) 1000 MG CAPS, Take by mouth., Disp: , Rfl:    SYMBICORT  160-4.5 MCG/ACT inhaler, Inhale 2 puffs into the lungs 2 (two) times daily., Disp: 10.2 g, Rfl: 5   triamcinolone  ointment (KENALOG ) 0.5 %, Apply 1 Application topically 2 (two) times daily as needed., Disp: 30 g, Rfl: 5   triamterene-hydrochlorothiazide (MAXZIDE-25) 37.5-25 MG tablet, Take 1 tablet by mouth 2 (two) times daily., Disp: , Rfl:    TURMERIC PO, Take by mouth., Disp: , Rfl:   Observations/Objective: Patient is well-developed, well-nourished in no acute distress.  Resting comfortably  at home.  Head is normocephalic, atraumatic.  No labored breathing.  Speech is clear and coherent with logical content.  Patient is alert and oriented at baseline.    Assessment and Plan:  1. Acute bacterial bronchitis (Primary)  - azithromycin  (ZITHROMAX ) 250 MG tablet; Take 2 tablets on day 1, then 1 tablet daily on days 2 through 5  Dispense: 6 tablet; Refill: 0 - promethazine -dextromethorphan (PROMETHAZINE -DM) 6.25-15 MG/5ML syrup; Take 5 mLs by mouth 4 (four) times daily as needed for cough.  Dispense: 118 mL; Refill: 0 - benzonatate  (TESSALON ) 100 MG capsule; Take 1 capsule (100 mg total) by mouth 3 (three) times daily as needed for cough.  Dispense: 30 capsule; Refill: 0 - predniSONE  (DELTASONE ) 20 MG tablet; Take 1 tablet (20 mg total) by mouth daily with breakfast for 5 days.  Dispense: 5 tablet; Refill: 0  2. Acute cough  -  promethazine -dextromethorphan (PROMETHAZINE -DM) 6.25-15 MG/5ML syrup; Take 5 mLs by mouth 4 (four) times daily as needed for cough.  Dispense: 118 mL; Refill: 0 - benzonatate  (TESSALON ) 100 MG capsule; Take 1 capsule (100 mg total) by mouth 3 (three) times daily as needed for cough.  Dispense: 30 capsule; Refill: 0 - predniSONE  (DELTASONE ) 20 MG tablet; Take 1 tablet (20 mg total) by mouth daily with breakfast for 5 days.  Dispense: 5 tablet; Refill: 0   - Take meds as prescribed - Rest voice - Use a cool mist humidifier especially during the winter months when heat dries out the air. - Use saline nose sprays frequently to help soothe nasal passages if they are drying out. - Stay hydrated by drinking plenty of fluids - Keep thermostat turn down low to prevent drying out which can cause a dry cough.  If you do not improve you will need a follow up visit in person.               Reviewed side effects, risks  and benefits of medication.    Patient acknowledged agreement and understanding of the plan.   Past Medical, Surgical, Social History, Allergies, and Medications have been Reviewed.    Follow Up Instructions: I discussed the assessment and treatment plan with the patient. The patient was provided an opportunity to ask questions and all were answered. The patient agreed with the plan and demonstrated an understanding of the instructions.  A copy of instructions were sent to the patient via MyChart unless otherwise noted below.    The patient was advised to call back or seek an in-person evaluation if the symptoms worsen or if the condition fails to improve as anticipated.    Caroline CHRISTELLA Barefoot, NP

## 2024-05-15 NOTE — Patient Instructions (Addendum)
 Caroline Gill, thank you for joining Caroline CHRISTELLA Barefoot, NP for today's virtual visit.  While this provider is not your primary care provider (PCP), if your PCP is located in our provider database this encounter information will be shared with them immediately following your visit.   A [facility] MyChart account gives you access to today's visit and all your visits, tests, and labs performed at [facility]  click here if you don't have a [facility] MyChart account or go to [email or web address]  Consent: (Patient) Caroline Gill provided verbal consent for this virtual visit at the beginning of the encounter.  Current Medications:  Current Outpatient Medications:    azithromycin  (ZITHROMAX ) 250 MG tablet, Take 2 tablets on day 1, then 1 tablet daily on days 2 through 5, Disp: 6 tablet, Rfl: 0   benzonatate  (TESSALON ) 100 MG capsule, Take 1 capsule (100 mg total) by mouth 3 (three) times daily as needed for cough., Disp: 30 capsule, Rfl: 0   predniSONE  (DELTASONE ) 20 MG tablet, Take 1 tablet (20 mg total) by mouth daily with breakfast for 5 days., Disp: 5 tablet, Rfl: 0   promethazine -dextromethorphan (PROMETHAZINE -DM) 6.25-15 MG/5ML syrup, Take 5 mLs by mouth 4 (four) times daily as needed for cough., Disp: 118 mL, Rfl: 0   albuterol  (VENTOLIN  HFA) 108 (90 Base) MCG/ACT inhaler, Inhale 2 puffs into the lungs every 6 (six) hours as needed for wheezing or shortness of breath., Disp: 18 g, Rfl: 2   EPINEPHrine  (AUVI-Q ) 0.3 mg/0.3 mL IJ SOAJ injection, Inject 0.3 mg into the muscle as needed for anaphylaxis., Disp: 1 each, Rfl: 1   KLOR-CON 20 MEQ packet, Take 20 mEq by mouth daily., Disp: , Rfl:    levocetirizine (XYZAL ) 5 MG tablet, Take 1 tablet (5 mg total) by mouth daily., Disp: 90 tablet, Rfl: 4   MAGNESIUM GLYCINATE PO, Take by mouth., Disp: , Rfl:    montelukast  (SINGULAIR ) 10 MG tablet, Take 1 tablet (10 mg total) by mouth at bedtime., Disp: 90 tablet, Rfl: 4   Multiple  Vitamin (MULTIVITAMIN PO), Take by mouth., Disp: , Rfl:    Omega-3 Fatty Acids (FISH OIL) 1000 MG CAPS, Take by mouth., Disp: , Rfl:    SYMBICORT  160-4.5 MCG/ACT inhaler, Inhale 2 puffs into the lungs 2 (two) times daily., Disp: 10.2 g, Rfl: 5   triamcinolone  ointment (KENALOG ) 0.5 %, Apply 1 Application topically 2 (two) times daily as needed., Disp: 30 g, Rfl: 5   triamterene-hydrochlorothiazide (MAXZIDE-25) 37.5-25 MG tablet, Take 1 tablet by mouth 2 (two) times daily., Disp: , Rfl:    TURMERIC PO, Take by mouth., Disp: , Rfl:    Medications ordered in this encounter:  Meds ordered this encounter  Medications   azithromycin  (ZITHROMAX ) 250 MG tablet    Sig: Take 2 tablets on day 1, then 1 tablet daily on days 2 through 5    Dispense:  6 tablet    Refill:  0    Supervising Provider:   LAMPTEY, PHILIP O [[id number]]   promethazine -dextromethorphan (PROMETHAZINE -DM) 6.25-15 MG/5ML syrup    Sig: Take 5 mLs by mouth 4 (four) times daily as needed for cough.    Dispense:  118 mL    Refill:  0    Supervising Provider:   BLAISE ALEENE KIDD [[id number]]   benzonatate  (TESSALON ) 100 MG capsule    Sig: Take 1 capsule (100 mg total) by mouth 3 (three) times daily as needed for cough.    Dispense:  30 capsule  Refill:  0    Supervising Provider:   BLAISE ALEENE KIDD [id number]   predniSONE  (DELTASONE ) 20 MG tablet    Sig: Take 1 tablet (20 mg total) by mouth daily with breakfast for 5 days.    Dispense:  5 tablet    Refill:  0    Supervising Provider:   BLAISE ALEENE KIDD [[id number]]     *If you need refills on other medications prior to your next appointment, please contact your pharmacy*  Follow-Up: Call back or seek an in-person evaluation if the symptoms worsen or if the condition fails to improve as anticipated.  [facility] [phone]  Other Instructions   How to Perform a Sinus Rinse A sinus rinse is a home treatment. It rinses your sinuses with a mixture of salt and  water (saline solution). Sinuses are air-filled spaces in your skull behind the bones of your face and forehead. They open into your nasal cavity. A sinus rinse can help to clear your nasal cavity. It can clear mucus, dirt, dust, or pollen. You may do a sinus rinse when you have: A cold. A virus. Allergies. A sinus infection. A stuffy nose. What are the risks? A sinus rinse is normally very safe and helpful. However, there are a few risks. These include: A burning feeling in the sinuses. This may happen if you do not make the saline solution as told. Follow all directions. Nasal irritation. Infection from unclean water. This is rare, but it can happen. Do not do a sinus rinse if you have had: Ear or nasal surgery. An ear infection. Plugged ears. Supplies needed: Saline solution or powder. Distilled or germ-free (sterile) water may be needed to mix with saline powder. You may use boiled and cooled tap water. Boil tap water for 5 minutes. Cool the water until it is lukewarm. Use within 24 hours. Do not use regular tap water to mix with the saline solution. Neti pot or nasal rinse bottle. These release the saline solution into your nose and through your sinuses. You can buy neti pots and rinse bottles: At your local pharmacy. At a health food store. Online. How to do a sinus rinse  Wash your hands with soap and water for at least 20 seconds. If you cannot use soap and water, use hand sanitizer. Wash your device using the directions that came with it. Dry your device. Use the solution that comes with your device or one that is sold separately in stores. Follow the mixing directions on the package if you need to mix with germ-free or distilled water. Fill your device with the amount of saline solution stated in the device instructions. Stand by a sink and tilt your head sideways over the sink. Place the spout of the device in your upper nostril (the one closer to the ceiling). Gently  pour or squeeze the saline solution into your nasal cavity. The liquid should drain to your lower nostril if you are not too stuffed up (congested). While rinsing, breathe through your open mouth. Gently blow your nose to clear any mucus and rinse solution. Blowing too hard may cause ear pain. Turn your head in the other direction and repeat in your other nostril. Clean and rinse your device with clean water. Air-dry your device. Talk with your doctor or pharmacist if you have questions about how to do a sinus rinse. Summary A sinus rinse is a home treatment. It rinses your sinuses with a mixture of salt and water (  saline solution). A sinus rinse can clear mucus, dirt, dust, or pollen. A sinus rinse is normally very safe and helpful. Follow all instructions carefully. This information is not intended to replace advice given to you by your health care provider. Make sure you discuss any questions you have with your health care provider. Document Revised: 11/18/2020 Document Reviewed: 11/18/2020 Elsevier Patient Education  2024 Elsevier Inc.       If you have been instructed to have an in-person evaluation today at a local Urgent Care facility, please use the link below. It will take you to a list of all of our available [facility], including address, phone number and hours of operation. Please do not delay care.  [facility]  If you or a family member do not have a primary care provider, use the link below to schedule a visit and establish care. When you choose a [facility] primary care physician or advanced practice provider, you gain a long-term partner in health. Find a Primary Care Provider  Learn more about [facility]'s in-office and virtual care options:  - Get Care Now

## 2024-05-16 ENCOUNTER — Encounter: Admitting: Family Medicine

## 2024-05-29 ENCOUNTER — Ambulatory Visit: Admitting: Audiology

## 2024-05-29 ENCOUNTER — Encounter: Payer: Self-pay | Admitting: Audiology

## 2024-05-29 DIAGNOSIS — H6991 Unspecified Eustachian tube disorder, right ear: Secondary | ICD-10-CM | POA: Insufficient documentation

## 2024-05-29 NOTE — Procedures (Signed)
°  Outpatient Audiology and [facility] [address] [place], [place]  [address] [phone]  AUDIOLOGICAL  EVALUATION  NAME: Priscila Bean     DOB:   [birth year]      MRN: [id number]                                                                                     DATE: 05/29/2024     REFERENT: Catherine Charlies LABOR, DO STATUS: Outpatient DIAGNOSIS: eustachian tube dysfunction   History: Miki was seen for an audiological evaluation. Paolina is followed by [facility]. Jeylin has eustachian tube dysfunction. Suanne reports otalgia and aural fullness. She denies dizziness. 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 Maryclare reports she hears her heartbeat in her right ear. Kyndel reports symptoms have occurred for 20 years. She denies concerns regarding her hearing sensitivity.   Evaluation:  Otoscopy showed a clear view of the tympanic membranes, bilaterally Tympanometry results were consistent with normal middle ear pressure and normal tympanic membrane mobility (Type A), bilaterally.  Audiometric testing was completed using Conventional Audiometry techniques with insert earphones and TDH headphones. Test results are consistent with normal hearing sensitivity at [phone] Hz in both ears. Speech Recognition Thresholds were obtained at 10 dB HL in the right ear and at 15  dB HL in the left ear. Word Recognition Testing was completed at 60 dB HL and Leisa scored 100% in both ears.    Results:  The test results were reviewed with [facility]. Test results are consistent with normal hearing sensitivity at [phone] Hz in both ears.   Recommendations: 1.   Follow up with ENT   25 minutes spent testing and counseling on results.   If you have any questions please feel free to contact me at [phone].  Darryle Posey Audiologist, Au.D., CCC-A 05/29/2024  3:54 PM  Cc: Catherine Charlies A, DO

## 2024-06-13 ENCOUNTER — Telehealth: Payer: Self-pay

## 2024-06-13 NOTE — Telephone Encounter (Signed)
 Communication  Reason for CRM: Patient has been sick since November deep cough and congestion and wanted to know if she should come in before her physical this Friday   Sent MyChart.

## 2024-06-14 ENCOUNTER — Ambulatory Visit (INDEPENDENT_AMBULATORY_CARE_PROVIDER_SITE_OTHER): Admitting: Family Medicine

## 2024-06-14 ENCOUNTER — Encounter: Payer: Self-pay | Admitting: Family Medicine

## 2024-06-14 VITALS — BP 122/76 | HR 68 | Temp 98.2°F | Wt 257.8 lb

## 2024-06-14 DIAGNOSIS — B9689 Other specified bacterial agents as the cause of diseases classified elsewhere: Secondary | ICD-10-CM | POA: Diagnosis not present

## 2024-06-14 DIAGNOSIS — R051 Acute cough: Secondary | ICD-10-CM

## 2024-06-14 DIAGNOSIS — J208 Acute bronchitis due to other specified organisms: Secondary | ICD-10-CM

## 2024-06-14 LAB — POCT INFLUENZA A/B
Influenza A, POC: NEGATIVE
Influenza B, POC: NEGATIVE

## 2024-06-14 MED ORDER — DOXYCYCLINE HYCLATE 100 MG PO TABS: 100.0000 mg | ORAL_TABLET | Freq: Two times a day (BID) | ORAL | 0 refills | Status: AC

## 2024-06-14 MED ORDER — METHYLPREDNISOLONE ACETATE 40 MG/ML IJ SUSP
80.0000 mg | Freq: Once | INTRAMUSCULAR | Status: AC
  Administered 2024-06-14: 80 mg via INTRAMUSCULAR

## 2024-06-14 MED ORDER — IPRATROPIUM BROMIDE 0.06 % NA SOLN: 1.0000 | Freq: Four times a day (QID) | NASAL | 1 refills | Status: AC

## 2024-06-14 MED ORDER — BENZONATATE 100 MG PO CAPS: 100.0000 mg | ORAL_CAPSULE | Freq: Three times a day (TID) | ORAL | 0 refills | Status: AC | PRN

## 2024-06-14 MED ORDER — PROMETHAZINE-DM 6.25-15 MG/5ML PO SYRP: 5.0000 mL | ORAL_SOLUTION | Freq: Four times a day (QID) | ORAL | 0 refills | Status: AC | PRN

## 2024-06-14 NOTE — Progress Notes (Signed)
 "      Caroline Gill , [birth year], 48 y.o., female MRN: [id number] Patient Care Team    Relationship Specialty Notifications Start End  Catherine Charlies LABOR, DO PCP - General Family Medicine  01/07/22   Euna Read, MD Consulting Physician Neurology  01/07/22   Mabel PARAS. Janan, MD Consulting Physician Cardiology  01/07/22   Kelby Melvern PENNER, MD Referring Physician Hematology and Oncology  01/07/22   Romilda Shove T  Gastroenterology  01/07/22    Comment: GAP  Javaid, Adnan, MD Referring Physician Pulmonary Disease  01/07/22   Iva Marty Saltness, MD Consulting Physician Allergy  and Immunology  08/05/23   Leonce Katz, DO Consulting Physician Sports Medicine  08/05/23   Jeannetta Lonni ORN, MD Consulting Physician Rheumatology  08/05/23     Chief Complaint  Patient presents with   Cough    Ongoing since Thanksgiving. Pt was seen by alternate provider on 12/1, prescribed z-pack and prednisone . Still c/o cough, congestion. Was exposed to Flu at Christmas.      Subjective: Caroline Gill is a 48 y.o. Pt presents for an OV with complaints of cough of ongoing for 4 weeks duration.  Associated symptoms include patient was seen by another provider 4 weeks ago and prescribed a Z-Pak and low-dose prednisone  20 mg x 5 days.  She states she did see some improvement in her symptoms but she still does not feel like her symptoms are completely treated.  She has residual cough and congestion.  She was also exposed to influenza Christmas.. Patient has a significant medical history asthma.     06/14/2024    8:12 AM 04/08/2023    8:13 AM 04/06/2022    8:45 AM 01/07/2022   10:02 AM  Depression screen PHQ 2/9  Decreased Interest 0 1 0 3  Down, Depressed, Hopeless 0 1 0 1  PHQ - 2 Score 0 2 0 4  Altered sleeping 1  1   Tired, decreased energy 1  1   Change in appetite 0  0   Feeling bad or failure about yourself  0 3 1   Trouble concentrating 0 1 1   Moving slowly or fidgety/restless 0 0 0    Suicidal thoughts 0 0 0   PHQ-9 Score 2  4    Difficult doing work/chores Not difficult at all Somewhat difficult       Data saved with a previous flowsheet row definition    Allergies[1] Social History   Social History Narrative   Marital status/children/pets: Married.  3 children.   Education/employment: College-educated.  Owner/CFO-employed   Safety:      -Wears a bicycle helmet riding a bike: Yes     -smoke alarm in the home:Yes     - wears seatbelt: Yes     - Feels safe in their relationships: Yes      Past Medical History:  Diagnosis Date   Arthritis    Asthma    Atrial fibrillation (HCC)    Chicken pox    Depression    Elevated WBC count 01/07/2022   Migraine    SVT (supraventricular tachycardia)    Past Surgical History:  Procedure Laterality Date   BREAST BIOPSY  2020   CHOLECYSTECTOMY  2013   ENDOMETRIAL ABLATION  2013   KNEE ARTHROSCOPY Right 2018   meniscus   KNEE SURGERY Left 1992   dislox   SHOULDER ARTHROSCOPY  2018   Labral tear with anchors x3   VAGINAL DELIVERY  X3, 1996, 1999, 2002   Family History  Problem Relation Age of Onset   Skin cancer Mother    Asthma Mother    Osteoarthritis Mother    Thyroid  disease Mother    Diabetes Mother    Stroke Mother    Atrial fibrillation Mother    Alcohol abuse Mother    Depression Mother    Hypertension Father    Osteoarthritis Father    Diabetes Father    Atrial fibrillation Father    Stroke Father    Hypertension Brother    Osteoarthritis Brother    Alcohol abuse Brother    Asthma Brother    Drug abuse Brother    Skin cancer Brother    Hypertension Maternal Grandmother    Diabetes Maternal Grandmother    Depression Maternal Grandmother    Osteoarthritis Maternal Grandmother    Miscarriages / Stillbirths Maternal Grandmother    Bone cancer Maternal Grandfather        late 80s   Dementia Maternal Grandfather    Rheum arthritis Paternal Grandmother    Depression Paternal  Grandmother    Migraines Paternal Grandmother    Heart attack Paternal Grandfather 46   Depression Daughter    Anxiety disorder Daughter    ADD / ADHD Daughter    ADD / ADHD Son    Hypothyroidism Son    ADD / ADHD Son    Angioedema Neg Hx    Allergic rhinitis Neg Hx    Immunodeficiency Neg Hx    Urticaria Neg Hx    Eczema Neg Hx    Allergies as of 06/14/2024       Reactions   Oxycodone Other (See Comments)   Severe headache Severe headache Severe headache   Tapentadol Rash   Dog Epithelium (canis Lupus Familiaris) Other (See Comments)   Tape         Medication List        Accurate as of June 14, 2024 11:53 AM. If you have any questions, ask your nurse or doctor.          STOP taking these medications    HYDROcodone-acetaminophen 5-325 MG tablet Commonly known as: NORCO/VICODIN Stopped by: Cieanna Stormes, DO   Klor-Con 20 MEQ packet Generic drug: potassium chloride Stopped by: Charlies Bellini, DO       TAKE these medications    albuterol  108 (90 Base) MCG/ACT inhaler Commonly known as: VENTOLIN  HFA Inhale 2 puffs into the lungs every 6 (six) hours as needed for wheezing or shortness of breath.   benzonatate  100 MG capsule Commonly known as: TESSALON  Take 1 capsule (100 mg total) by mouth 3 (three) times daily as needed for cough.   cyclobenzaprine  5 MG tablet Commonly known as: FLEXERIL  Take 1 tablet (5 mg total) by mouth at bedtime as needed for muscle spasms.   doxycycline  100 MG tablet Commonly known as: VIBRA -TABS Take 1 tablet (100 mg total) by mouth 2 (two) times daily. Started by: Charlies Bellini, DO   EPINEPHrine  0.3 mg/0.3 mL Soaj injection Commonly known as: Auvi-Q  Inject 0.3 mg into the muscle as needed for anaphylaxis.   Fish Oil 1000 MG Caps Take by mouth.   fluticasone 50 MCG/ACT nasal spray Commonly known as: FLONASE   ipratropium 0.06 % nasal spray Commonly known as: ATROVENT  Place 1 spray into both nostrils 4 (four) times  daily. What changed: See the new instructions. Changed by: Charlies Bellini, DO   ketoconazole 2 % cream Commonly known as: NIZORAL Apply topically.  levocetirizine 5 MG tablet Commonly known as: XYZAL  Take 1 tablet (5 mg total) by mouth daily.   MAGNESIUM GLYCINATE PO Take by mouth.   montelukast  10 MG tablet Commonly known as: SINGULAIR  Take 1 tablet (10 mg total) by mouth at bedtime.   MULTIVITAMIN PO Take by mouth.   promethazine -dextromethorphan 6.25-15 MG/5ML syrup Commonly known as: PROMETHAZINE -DM Take 5 mLs by mouth 4 (four) times daily as needed for cough.   Symbicort  160-4.5 MCG/ACT inhaler Generic drug: budesonide-formoterol Inhale 2 puffs into the lungs 2 (two) times daily.   triamcinolone  ointment 0.5 % Commonly known as: KENALOG  Apply 1 Application topically 2 (two) times daily as needed.   triamterene-hydrochlorothiazide 37.5-25 MG tablet Commonly known as: MAXZIDE-25 Take 1 tablet by mouth 2 (two) times daily.   TURMERIC PO Take by mouth.        All past medical history, surgical history, allergies, family history, immunizations andmedications were updated in the EMR today and reviewed under the history and medication portions of their EMR.     Review of Systems  Constitutional:  Positive for malaise/fatigue.  Respiratory:  Positive for cough.   Cardiovascular: Negative.   Gastrointestinal:  Negative for abdominal pain, diarrhea, nausea and vomiting.  Genitourinary: Negative.   Skin:  Negative for rash.  Endo/Heme/Allergies: Negative.   Psychiatric/Behavioral: Negative.     Negative, with the exception of above mentioned in HPI   Objective:  BP 122/76   Pulse 68   Temp 98.2 F (36.8 C)   Wt 257 lb 12.8 oz (116.9 kg)   SpO2 97%   BMI 36.47 kg/m  Body mass index is 36.47 kg/m. Physical Exam Vitals and nursing note reviewed.  Constitutional:      General: She is not in acute distress.    Appearance: Normal appearance. She is normal  weight. She is not ill-appearing or toxic-appearing.  HENT:     Head: Normocephalic and atraumatic.     Right Ear: Ear canal and external ear normal. A middle ear effusion is present. Tympanic membrane is not erythematous or bulging.     Left Ear: Ear canal and external ear normal. A middle ear effusion is present. Tympanic membrane is not erythematous or bulging.     Nose: Congestion and rhinorrhea present.     Comments: Postnasal drip present    Mouth/Throat:     Mouth: Mucous membranes are moist.     Pharynx: No oropharyngeal exudate or posterior oropharyngeal erythema.  Eyes:     General: No scleral icterus.       Right eye: No discharge.        Left eye: No discharge.     Extraocular Movements: Extraocular movements intact.     Conjunctiva/sclera: Conjunctivae normal.     Pupils: Pupils are equal, round, and reactive to light.  Cardiovascular:     Rate and Rhythm: Normal rate and regular rhythm.     Heart sounds: No murmur heard. Pulmonary:     Effort: Pulmonary effort is normal. No respiratory distress.     Breath sounds: Normal breath sounds. No wheezing, rhonchi or rales.     Comments: Deep bronchial cough present Abdominal:     General: Abdomen is flat.     Palpations: Abdomen is soft.  Musculoskeletal:     Cervical back: Neck supple.  Lymphadenopathy:     Cervical: No cervical adenopathy.  Skin:    Findings: No rash.  Neurological:     Mental Status: She is alert and oriented to  person, place, and time. Mental status is at baseline.     Motor: No weakness.     Coordination: Coordination normal.     Gait: Gait normal.  Psychiatric:        Mood and Affect: Mood normal.        Behavior: Behavior normal.        Thought Content: Thought content normal.        Judgment: Judgment normal.     No results found. No results found. Results for orders placed or performed in visit on 06/14/24 (from the past 24 hours)  POCT Influenza A/B     Status: Normal   Collection  Time: 06/14/24  8:18 AM  Result Value Ref Range   Influenza A, POC Negative Negative   Influenza B, POC Negative Negative    Assessment/Plan: Caroline Gill is a 48 y.o. female present for OV for  Acute cough - POCT Influenza A/B> negative  Acute bacterial bronchitis (Primary) Rest, hydrate.  Continue allergy  regimen mucinex (DM if cough), nasal saline flushes recommended Atrovent  nasal spray prescribed Promethazine -DM and Tessalon  Perles prescribed Doxycycline  twice daily prescribed, take until completed.  IM Depo-Medrol 80 provided today If cough present it can last up to 6-8 weeks.  F/U 2 weeks if not improved.   Reviewed expectations re: course of current medical issues. Discussed self-management of symptoms. Outlined signs and symptoms indicating need for more acute intervention. Patient verbalized understanding and all questions were answered. Patient received an After-Visit Summary.    Orders Placed This Encounter  Procedures   POCT Influenza A/B   Meds ordered this encounter  Medications   ipratropium (ATROVENT ) 0.06 % nasal spray    Sig: Place 1 spray into both nostrils 4 (four) times daily.    Dispense:  15 mL    Refill:  1   promethazine -dextromethorphan (PROMETHAZINE -DM) 6.25-15 MG/5ML syrup    Sig: Take 5 mLs by mouth 4 (four) times daily as needed for cough.    Dispense:  118 mL    Refill:  0   benzonatate  (TESSALON ) 100 MG capsule    Sig: Take 1 capsule (100 mg total) by mouth 3 (three) times daily as needed for cough.    Dispense:  30 capsule    Refill:  0   doxycycline  (VIBRA -TABS) 100 MG tablet    Sig: Take 1 tablet (100 mg total) by mouth 2 (two) times daily.    Dispense:  14 tablet    Refill:  0   methylPREDNISolone acetate (DEPO-MEDROL) injection 80 mg   Referral Orders  No referral(s) requested today     Note is dictated utilizing voice recognition software. Although note has been proof read prior to signing, occasional typographical  errors still can be missed. If any questions arise, please do not hesitate to call for verification.   electronically signed by:  Charlies Bellini, DO  [facility] [place]       [1]  Allergies Allergen Reactions   Oxycodone Other (See Comments)    Severe headache Severe headache Severe headache   Tapentadol Rash   Dog Epithelium (Canis Lupus Familiaris) Other (See Comments)   Tape    "

## 2024-06-16 ENCOUNTER — Encounter: Admitting: Family Medicine

## 2024-07-04 ENCOUNTER — Ambulatory Visit: Payer: Self-pay | Admitting: Internal Medicine

## 2024-07-27 ENCOUNTER — Encounter: Admitting: Family Medicine

## 2024-08-10 ENCOUNTER — Ambulatory Visit: Admitting: Allergy & Immunology
